# Patient Record
Sex: Male | Born: 1970 | Race: Black or African American | Hispanic: No | Marital: Single | Smoking: Never smoker
Health system: Southern US, Community
[De-identification: ages and names within clinical notes are randomized; demographics above are authoritative.]

## PROBLEM LIST (undated history)

## (undated) DIAGNOSIS — I10 Essential (primary) hypertension: Secondary | ICD-10-CM

## (undated) DIAGNOSIS — Z21 Asymptomatic human immunodeficiency virus [HIV] infection status: Secondary | ICD-10-CM

## (undated) DIAGNOSIS — I639 Cerebral infarction, unspecified: Secondary | ICD-10-CM

## (undated) DIAGNOSIS — R569 Unspecified convulsions: Secondary | ICD-10-CM

## (undated) DIAGNOSIS — B20 Human immunodeficiency virus [HIV] disease: Secondary | ICD-10-CM

## (undated) HISTORY — PX: COSMETIC SURGERY: SHX468

## (undated) HISTORY — PX: BRAIN SURGERY: SHX531

---

## 2004-04-10 ENCOUNTER — Emergency Department (HOSPITAL_COMMUNITY): Admission: EM | Admit: 2004-04-10 | Discharge: 2004-04-10 | Payer: Self-pay | Admitting: Emergency Medicine

## 2005-03-02 ENCOUNTER — Emergency Department (HOSPITAL_COMMUNITY): Admission: EM | Admit: 2005-03-02 | Discharge: 2005-03-02 | Payer: Self-pay | Admitting: Emergency Medicine

## 2005-03-11 ENCOUNTER — Emergency Department: Payer: Self-pay | Admitting: Emergency Medicine

## 2005-04-09 ENCOUNTER — Emergency Department: Payer: Self-pay | Admitting: Emergency Medicine

## 2005-07-02 ENCOUNTER — Emergency Department: Payer: Self-pay | Admitting: Internal Medicine

## 2005-08-17 ENCOUNTER — Emergency Department: Payer: Self-pay | Admitting: Emergency Medicine

## 2005-10-14 ENCOUNTER — Emergency Department: Payer: Self-pay | Admitting: General Practice

## 2005-12-24 ENCOUNTER — Emergency Department: Payer: Self-pay | Admitting: Emergency Medicine

## 2006-06-06 ENCOUNTER — Emergency Department: Payer: Self-pay | Admitting: Emergency Medicine

## 2006-06-07 ENCOUNTER — Emergency Department: Payer: Self-pay | Admitting: Emergency Medicine

## 2006-06-10 ENCOUNTER — Emergency Department: Payer: Self-pay | Admitting: Emergency Medicine

## 2007-07-30 ENCOUNTER — Emergency Department: Payer: Self-pay | Admitting: Internal Medicine

## 2008-01-10 ENCOUNTER — Emergency Department: Payer: Self-pay | Admitting: Emergency Medicine

## 2008-08-14 ENCOUNTER — Emergency Department: Payer: Self-pay | Admitting: Emergency Medicine

## 2009-01-11 ENCOUNTER — Emergency Department: Payer: Self-pay | Admitting: Emergency Medicine

## 2009-02-03 ENCOUNTER — Emergency Department: Payer: Self-pay | Admitting: Emergency Medicine

## 2009-06-28 ENCOUNTER — Emergency Department: Payer: Self-pay | Admitting: Emergency Medicine

## 2009-09-10 ENCOUNTER — Emergency Department: Payer: Self-pay | Admitting: Emergency Medicine

## 2010-07-19 ENCOUNTER — Emergency Department: Payer: Self-pay | Admitting: Unknown Physician Specialty

## 2010-09-24 ENCOUNTER — Emergency Department: Payer: Self-pay | Admitting: Internal Medicine

## 2011-05-14 ENCOUNTER — Emergency Department: Payer: Self-pay | Admitting: Emergency Medicine

## 2011-07-06 ENCOUNTER — Emergency Department: Payer: Self-pay | Admitting: Emergency Medicine

## 2011-09-10 ENCOUNTER — Emergency Department: Payer: Self-pay | Admitting: Emergency Medicine

## 2011-10-13 ENCOUNTER — Emergency Department: Payer: Self-pay | Admitting: Emergency Medicine

## 2011-11-19 ENCOUNTER — Emergency Department: Payer: Self-pay | Admitting: Emergency Medicine

## 2011-11-19 LAB — CBC
HCT: 42.7 % (ref 40.0–52.0)
HGB: 13.7 g/dL (ref 13.0–18.0)
MCH: 28.1 pg (ref 26.0–34.0)
MCHC: 32.1 g/dL (ref 32.0–36.0)
MCV: 88 fL (ref 80–100)
Platelet: 208 10*3/uL (ref 150–440)
RBC: 4.87 10*6/uL (ref 4.40–5.90)
WBC: 8.6 10*3/uL (ref 3.8–10.6)

## 2011-11-19 LAB — COMPREHENSIVE METABOLIC PANEL
Alkaline Phosphatase: 78 U/L (ref 50–136)
Anion Gap: 11 (ref 7–16)
Bilirubin,Total: 0.2 mg/dL (ref 0.2–1.0)
Calcium, Total: 8.5 mg/dL (ref 8.5–10.1)
Chloride: 106 mmol/L (ref 98–107)
SGPT (ALT): 17 U/L
Sodium: 140 mmol/L (ref 136–145)
Total Protein: 7.7 g/dL (ref 6.4–8.2)

## 2011-11-19 LAB — URINALYSIS, COMPLETE
Nitrite: NEGATIVE
Ph: 5 (ref 4.5–8.0)
WBC UR: 2 /HPF (ref 0–5)

## 2011-12-30 ENCOUNTER — Emergency Department: Payer: Self-pay | Admitting: Emergency Medicine

## 2012-04-19 ENCOUNTER — Emergency Department: Payer: Self-pay | Admitting: Emergency Medicine

## 2012-05-28 ENCOUNTER — Emergency Department: Payer: Self-pay | Admitting: *Deleted

## 2012-09-22 ENCOUNTER — Emergency Department: Payer: Self-pay | Admitting: Emergency Medicine

## 2012-11-08 ENCOUNTER — Emergency Department: Payer: Self-pay | Admitting: Emergency Medicine

## 2013-03-12 ENCOUNTER — Emergency Department: Payer: Self-pay | Admitting: Emergency Medicine

## 2013-03-21 ENCOUNTER — Emergency Department (HOSPITAL_COMMUNITY): Payer: Medicaid Other

## 2013-03-21 ENCOUNTER — Emergency Department (HOSPITAL_COMMUNITY)
Admission: EM | Admit: 2013-03-21 | Discharge: 2013-03-21 | Disposition: A | Discharge status: Home / Self Care | Payer: Medicaid Other | Attending: Emergency Medicine | Admitting: Emergency Medicine

## 2013-03-21 DIAGNOSIS — Y9389 Activity, other specified: Secondary | ICD-10-CM | POA: Insufficient documentation

## 2013-03-21 DIAGNOSIS — S4980XA Other specified injuries of shoulder and upper arm, unspecified arm, initial encounter: Secondary | ICD-10-CM | POA: Insufficient documentation

## 2013-03-21 DIAGNOSIS — S46909A Unspecified injury of unspecified muscle, fascia and tendon at shoulder and upper arm level, unspecified arm, initial encounter: Secondary | ICD-10-CM | POA: Insufficient documentation

## 2013-03-21 DIAGNOSIS — S298XXA Other specified injuries of thorax, initial encounter: Secondary | ICD-10-CM | POA: Insufficient documentation

## 2013-03-21 DIAGNOSIS — M79602 Pain in left arm: Secondary | ICD-10-CM

## 2013-03-21 DIAGNOSIS — Y92410 Unspecified street and highway as the place of occurrence of the external cause: Secondary | ICD-10-CM | POA: Insufficient documentation

## 2013-03-21 DIAGNOSIS — T07XXXA Unspecified multiple injuries, initial encounter: Secondary | ICD-10-CM

## 2013-03-21 LAB — CBC WITH DIFFERENTIAL/PLATELET
Basophils Absolute: 0 10*3/uL (ref 0.0–0.1)
Basophils Relative: 1 % (ref 0–1)
Eosinophils Absolute: 0.2 10*3/uL (ref 0.0–0.7)
Eosinophils Relative: 3 % (ref 0–5)
HCT: 38.9 % — ABNORMAL LOW (ref 39.0–52.0)
Lymphocytes Relative: 38 % (ref 12–46)
Lymphs Abs: 2.1 10*3/uL (ref 0.7–4.0)
MCHC: 34.7 g/dL (ref 30.0–36.0)
MCV: 83.5 fL (ref 78.0–100.0)
Monocytes Absolute: 0.7 10*3/uL (ref 0.1–1.0)
Monocytes Relative: 13 % — ABNORMAL HIGH (ref 3–12)
Neutro Abs: 2.4 10*3/uL (ref 1.7–7.7)
RBC: 4.66 MIL/uL (ref 4.22–5.81)
RDW: 12.6 % (ref 11.5–15.5)
WBC: 5.4 10*3/uL (ref 4.0–10.5)

## 2013-03-21 LAB — BASIC METABOLIC PANEL
Calcium: 9.4 mg/dL (ref 8.4–10.5)
Creatinine, Ser: 0.98 mg/dL (ref 0.50–1.35)
GFR calc non Af Amer: >90 mL/min (ref >90)
Sodium: 140 mEq/L (ref 135–145)

## 2013-03-21 MED ORDER — MORPHINE SULFATE 4 MG/ML IJ SOLN
4.0000 mg | Freq: Once | INTRAMUSCULAR | Status: AC
  Administered 2013-03-21: 4 mg via INTRAVENOUS
  Filled 2013-03-21: qty 1

## 2013-03-21 MED ORDER — IBUPROFEN 800 MG PO TABS: 800.0000 mg | ORAL_TABLET | Freq: Three times a day (TID) | ORAL | Status: DC

## 2013-03-21 MED ORDER — OXYCODONE-ACETAMINOPHEN 5-325 MG PO TABS: 1.0000 | ORAL_TABLET | ORAL | Status: DC | PRN

## 2013-03-21 MED ORDER — IOHEXOL 300 MG/ML  SOLN
100.0000 mL | Freq: Once | INTRAMUSCULAR | Status: AC | PRN
  Administered 2013-03-21: 100 mL via INTRAVENOUS
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
## 2013-04-11 ENCOUNTER — Emergency Department: Payer: Self-pay | Admitting: Emergency Medicine

## 2013-04-11 LAB — ETHANOL: Ethanol %: <0.003 % (ref 0.000–0.080)

## 2013-04-11 LAB — CBC WITH DIFFERENTIAL/PLATELET
Basophil %: 0.9 %
Eosinophil %: 0.3 %
HCT: 40 % (ref 40.0–52.0)
HGB: 13.3 g/dL (ref 13.0–18.0)
Lymphocyte #: 1.3 10*3/uL (ref 1.0–3.6)
Lymphocyte %: 16 %
MCH: 28.4 pg (ref 26.0–34.0)
MCHC: 33.2 g/dL (ref 32.0–36.0)
Monocyte #: 1 x10 3/mm (ref 0.2–1.0)
Monocyte %: 12.7 %
Neutrophil %: 70.1 %
RDW: 13.4 % (ref 11.5–14.5)
WBC: 8.2 10*3/uL (ref 3.8–10.6)

## 2013-04-11 LAB — COMPREHENSIVE METABOLIC PANEL
Albumin: 3.5 g/dL (ref 3.4–5.0)
Alkaline Phosphatase: 111 U/L (ref 50–136)
Anion Gap: 6 — ABNORMAL LOW (ref 7–16)
BUN: 12 mg/dL (ref 7–18)
Bilirubin,Total: 0.3 mg/dL (ref 0.2–1.0)
Calcium, Total: 8.6 mg/dL (ref 8.5–10.1)
Co2: 23 mmol/L (ref 21–32)
EGFR (African American): >60
EGFR (Non-African Amer.): >60
Osmolality: 271 (ref 275–301)
SGOT(AST): 17 U/L (ref 15–37)
SGPT (ALT): 14 U/L (ref 12–78)
Sodium: 136 mmol/L (ref 136–145)
Total Protein: 7.4 g/dL (ref 6.4–8.2)

## 2013-04-11 LAB — URINALYSIS, COMPLETE
Bilirubin,UR: NEGATIVE
Ketone: NEGATIVE
Specific Gravity: 1.008 (ref 1.003–1.030)
Squamous Epithelial: 1
WBC UR: 27 /HPF (ref 0–5)

## 2013-04-16 LAB — CULTURE, BLOOD (SINGLE)

## 2013-05-16 ENCOUNTER — Emergency Department: Payer: Self-pay | Admitting: Emergency Medicine

## 2013-08-12 ENCOUNTER — Emergency Department: Payer: Self-pay | Admitting: Emergency Medicine

## 2013-08-12 LAB — RAPID INFLUENZA A&B ANTIGENS (ARMC ONLY)

## 2013-09-24 ENCOUNTER — Emergency Department: Payer: Self-pay | Admitting: Emergency Medicine

## 2013-10-19 ENCOUNTER — Emergency Department: Payer: Self-pay | Admitting: Emergency Medicine

## 2013-11-19 ENCOUNTER — Emergency Department: Payer: Self-pay | Admitting: Emergency Medicine

## 2013-11-19 LAB — COMPREHENSIVE METABOLIC PANEL
Albumin: 3.4 g/dL (ref 3.4–5.0)
Alkaline Phosphatase: 83 U/L
Anion Gap: 4 — ABNORMAL LOW (ref 7–16)
BUN: 14 mg/dL (ref 7–18)
Bilirubin,Total: 0.1 mg/dL — ABNORMAL LOW (ref 0.2–1.0)
CALCIUM: 8.1 mg/dL — AB (ref 8.5–10.1)
CO2: 25 mmol/L (ref 21–32)
CREATININE: 1 mg/dL (ref 0.60–1.30)
Chloride: 107 mmol/L (ref 98–107)
EGFR (African American): >60
EGFR (Non-African Amer.): >60
GLUCOSE: 77 mg/dL (ref 65–99)
OSMOLALITY: 271 (ref 275–301)
POTASSIUM: 3.9 mmol/L (ref 3.5–5.1)
SGOT(AST): 24 U/L (ref 15–37)
SGPT (ALT): 16 U/L (ref 12–78)
Sodium: 136 mmol/L (ref 136–145)
TOTAL PROTEIN: 7.2 g/dL (ref 6.4–8.2)

## 2013-11-19 LAB — URINALYSIS, COMPLETE
Bilirubin,UR: NEGATIVE
GLUCOSE, UR: NEGATIVE mg/dL (ref 0–75)
Ketone: NEGATIVE
NITRITE: NEGATIVE
PH: 6 (ref 4.5–8.0)
Protein: 30
SPECIFIC GRAVITY: 1.001 (ref 1.003–1.030)
Squamous Epithelial: <1

## 2013-11-19 LAB — ETHANOL

## 2013-11-19 LAB — CBC
HCT: 40 % (ref 40.0–52.0)
HGB: 13 g/dL (ref 13.0–18.0)
MCH: 28.9 pg (ref 26.0–34.0)
MCHC: 32.4 g/dL (ref 32.0–36.0)
MCV: 89 fL (ref 80–100)
Platelet: 228 10*3/uL (ref 150–440)
RBC: 4.49 10*6/uL (ref 4.40–5.90)
RDW: 15.1 % — AB (ref 11.5–14.5)
WBC: 7 10*3/uL (ref 3.8–10.6)

## 2014-01-03 ENCOUNTER — Ambulatory Visit: Payer: Self-pay | Admitting: Unknown Physician Specialty

## 2014-01-07 ENCOUNTER — Ambulatory Visit: Payer: Self-pay | Admitting: Unknown Physician Specialty

## 2014-06-11 ENCOUNTER — Emergency Department: Payer: Self-pay | Admitting: Emergency Medicine

## 2014-07-17 ENCOUNTER — Emergency Department: Payer: Self-pay | Admitting: Emergency Medicine

## 2014-07-17 LAB — URINALYSIS, COMPLETE
Bilirubin,UR: NEGATIVE
GLUCOSE, UR: NEGATIVE mg/dL (ref 0–75)
Ketone: NEGATIVE
NITRITE: NEGATIVE
PH: 6 (ref 4.5–8.0)
Protein: 100
RBC,UR: 23841 /HPF (ref 0–5)
SQUAMOUS EPITHELIAL: NONE SEEN
Specific Gravity: 1.021 (ref 1.003–1.030)
WBC UR: 1441 /HPF (ref 0–5)

## 2014-07-29 ENCOUNTER — Emergency Department: Payer: Self-pay | Admitting: Emergency Medicine

## 2014-07-29 LAB — COMPREHENSIVE METABOLIC PANEL
ALBUMIN: 3.6 g/dL (ref 3.4–5.0)
ALT: 26 U/L
Alkaline Phosphatase: 77 U/L
Anion Gap: 8 (ref 7–16)
BUN: 16 mg/dL (ref 7–18)
Bilirubin,Total: 0.5 mg/dL (ref 0.2–1.0)
Calcium, Total: 8.5 mg/dL (ref 8.5–10.1)
Chloride: 107 mmol/L (ref 98–107)
Co2: 23 mmol/L (ref 21–32)
Creatinine: 1.08 mg/dL (ref 0.60–1.30)
EGFR (African American): >60
Glucose: 79 mg/dL (ref 65–99)
OSMOLALITY: 276 (ref 275–301)
POTASSIUM: 4 mmol/L (ref 3.5–5.1)
SGOT(AST): 30 U/L (ref 15–37)
Sodium: 138 mmol/L (ref 136–145)
Total Protein: 7.7 g/dL (ref 6.4–8.2)

## 2014-07-29 LAB — URINALYSIS, COMPLETE
Bilirubin,UR: NEGATIVE
GLUCOSE, UR: NEGATIVE mg/dL (ref 0–75)
KETONE: NEGATIVE
Nitrite: NEGATIVE
PH: 6 (ref 4.5–8.0)
Protein: 100
Specific Gravity: 1.024 (ref 1.003–1.030)
Squamous Epithelial: 11

## 2014-07-29 LAB — CBC
HCT: 43.2 % (ref 40.0–52.0)
HGB: 14.1 g/dL (ref 13.0–18.0)
MCH: 29.5 pg (ref 26.0–34.0)
MCHC: 32.5 g/dL (ref 32.0–36.0)
MCV: 91 fL (ref 80–100)
Platelet: 256 10*3/uL (ref 150–440)
RBC: 4.76 10*6/uL (ref 4.40–5.90)
RDW: 13 % (ref 11.5–14.5)
WBC: 6.7 10*3/uL (ref 3.8–10.6)

## 2014-07-31 LAB — URINE CULTURE

## 2014-08-04 ENCOUNTER — Emergency Department: Payer: Self-pay | Admitting: Student

## 2014-08-25 ENCOUNTER — Emergency Department: Payer: Self-pay | Admitting: Student

## 2014-08-28 ENCOUNTER — Emergency Department: Payer: Self-pay | Admitting: Emergency Medicine

## 2015-03-08 ENCOUNTER — Encounter: Payer: Self-pay | Admitting: Urgent Care

## 2015-03-08 DIAGNOSIS — Z791 Long term (current) use of non-steroidal anti-inflammatories (NSAID): Secondary | ICD-10-CM | POA: Insufficient documentation

## 2015-03-08 DIAGNOSIS — Z7982 Long term (current) use of aspirin: Secondary | ICD-10-CM | POA: Diagnosis not present

## 2015-03-08 DIAGNOSIS — Z720 Tobacco use: Secondary | ICD-10-CM | POA: Diagnosis not present

## 2015-03-08 DIAGNOSIS — Z880 Allergy status to penicillin: Secondary | ICD-10-CM | POA: Diagnosis not present

## 2015-03-08 DIAGNOSIS — Z79810 Long term (current) use of selective estrogen receptor modulators (SERMs): Secondary | ICD-10-CM | POA: Diagnosis not present

## 2015-03-08 DIAGNOSIS — H109 Unspecified conjunctivitis: Secondary | ICD-10-CM | POA: Insufficient documentation

## 2015-03-08 DIAGNOSIS — Z79899 Other long term (current) drug therapy: Secondary | ICD-10-CM | POA: Diagnosis not present

## 2015-03-08 DIAGNOSIS — H578 Other specified disorders of eye and adnexa: Secondary | ICD-10-CM | POA: Diagnosis present

## 2015-03-08 NOTE — ED Notes (Deleted)
Patient presents with c/o RIGHT ear pain x 1 week. (+) bloody drainage noted yesterday. Denies fever.

## 2015-03-09 ENCOUNTER — Emergency Department
Admission: EM | Admit: 2015-03-09 | Discharge: 2015-03-09 | Disposition: A | Discharge status: Home / Self Care | Payer: Medicaid Other | Attending: Emergency Medicine | Admitting: Emergency Medicine

## 2015-03-09 ENCOUNTER — Encounter: Payer: Self-pay | Admitting: Urgent Care

## 2015-03-09 DIAGNOSIS — H109 Unspecified conjunctivitis: Secondary | ICD-10-CM

## 2015-03-09 MED ORDER — FLUORESCEIN SODIUM 1 MG OP STRP
ORAL_STRIP | OPHTHALMIC | Status: AC
Start: 2015-03-09 — End: 2015-03-09
  Administered 2015-03-09: 02:00:00
  Filled 2015-03-09: qty 1

## 2015-03-09 MED ORDER — ERYTHROMYCIN 5 MG/GM OP OINT
TOPICAL_OINTMENT | Freq: Once | OPHTHALMIC | Status: AC
  Administered 2015-03-09: 1 via OPHTHALMIC
  Filled 2015-03-09: qty 1

## 2015-03-09 MED ORDER — ERYTHROMYCIN 5 MG/GM OP OINT
TOPICAL_OINTMENT | Freq: Three times a day (TID) | OPHTHALMIC | Status: AC
Start: 2015-03-09 — End: 2015-03-19

## 2015-03-09 MED ORDER — TETRACAINE HCL 0.5 % OP SOLN
1.0000 [drp] | Freq: Once | OPHTHALMIC | Status: AC
  Administered 2015-03-09: 1 [drp] via OPHTHALMIC

## 2015-03-09 NOTE — ED Provider Notes (Addendum)
[facility] Emergency Department Provider Note  Time seen: 1:25 AM  I have reviewed the triage vital signs and the nursing notes.   HISTORY  Chief Complaint Eye Drainage    HPI Jonathan Deleon is a 44 y.o. male with 2-3 days of left eye redness and drainage. Patient states discomfort in the left eye, along with redness, and a yellowish discharge from the eye. States his symptoms have been ongoing for 2-3 days. She feels like there might be something in the eye. Denies any fever, nausea, vomiting. Denies any blurred vision or visual changes.     History reviewed. No pertinent past medical history.  There are no active problems to display for this patient.   History reviewed. No pertinent past surgical history.  Current Outpatient Rx  Name  Route  Sig  Dispense  Refill  . aspirin 325 MG tablet   Oral   Take 325 mg by mouth daily.         Marland Kitchen BIOTIN PO   Oral   Take 1 tablet by mouth daily.         Marland Kitchen efavirenz-emtricitabine-tenofovir (ATRIPLA) 600-200-300 MG per tablet   Oral   Take 1 tablet by mouth at bedtime.         Marland Kitchen estradiol (ESTRACE) 1 MG tablet   Oral   Take 4 mg by mouth daily.         Marland Kitchen ibuprofen (ADVIL,MOTRIN) 800 MG tablet   Oral   Take 1 tablet (800 mg total) by mouth 3 (three) times daily.   21 tablet   0   . medroxyPROGESTERone (PROVERA) 2.5 MG tablet   Oral   Take 2.5 mg by mouth daily.         Marland Kitchen oxyCODONE-acetaminophen (PERCOCET/ROXICET) 5-325 MG per tablet   Oral   Take 1 tablet by mouth every 4 (four) hours as needed for pain.   20 tablet   0     Allergies Penicillins  No family history on file.  Social History History  Substance Use Topics  . Smoking status: Current Every Day Smoker  . Smokeless tobacco: Not on file  . Alcohol Use: Yes    Review of Systems Constitutional: Negative for fever. Eyes: Positive for discharge, and redness. Cardiovascular: Negative for chest pain. Respiratory:  Negative for shortness of breath. Gastrointestinal: Negative for abdominal pain 10-point ROS otherwise negative.  ____________________________________________   PHYSICAL EXAM:  VITAL SIGNS: ED Triage Vitals  Enc Vitals Group     BP 03/09/15 0011 128/83 mmHg     Pulse Rate 03/09/15 0011 89     Resp 03/09/15 0011 18     Temp 03/09/15 0011 98.3 F (36.8 C)     Temp Source 03/09/15 0011 Oral     SpO2 03/09/15 0011 98 %     Weight 03/09/15 0011 160 lb (72.576 kg)     Height 03/09/15 0011 5\' 9"  (1.753 m)     Head Cir --      Peak Flow --      Pain Score 03/08/15 2347 7     Pain Loc --      Pain Edu? --      Excl. in GC? --     Constitutional: Alert and oriented. Well appearing and in no distress. Eyes: Left eye has conjunctival injection, mild clear/white discharge from the eye currently. 2 mm PERRL ENT   Head: Normocephalic and atraumatic. Cardiovascular: Normal rate, regular rhythm. No murmur Respiratory:  Normal respiratory effort without tachypnea nor retractions. Breath sounds are clear and equal bilaterally. No wheezes/rales/rhonchi. Gastrointestinal: Soft and nontender. No distention.   Musculoskeletal: Nontender with normal range of motion in all extremities.  Neurologic:  Normal speech and language. No gross focal neurologic deficits  Skin:  Skin is warm, dry and intact.  Psychiatric: Mood and affect are normal. Speech and behavior are normal.   ____________________________________________  INITIAL IMPRESSION / ASSESSMENT AND PLAN / ED COURSE  Pertinent labs & imaging results that were available during my care of the patient were reviewed by me and considered in my medical decision making (see chart for details).  Patient with exam consistent with a left conjunctivitis. Patient does not wear contacts. We will place the patient on erythromycin ointment, with primary care follow-up. Patient agreeable.  Patient eye examined with tetracaine, floor seen, no abrasion  seen. Eyelids everted, no foreign body identified.  ____________________________________________   FINAL CLINICAL IMPRESSION(S) / ED DIAGNOSES  Left conjunctivitis   Minna Antis, MD 03/09/15 0127  Minna Antis, MD 03/09/15 [phone]

## 2015-03-09 NOTE — ED Notes (Signed)
Patient presents with c/o drainage from LEFT eye x 2-3 days. Drainage is yellow. (+) scleral erythema noted.

## 2015-03-09 NOTE — Discharge Instructions (Signed)

## 2015-04-03 ENCOUNTER — Other Ambulatory Visit: Payer: Self-pay

## 2015-04-03 DIAGNOSIS — Z7982 Long term (current) use of aspirin: Secondary | ICD-10-CM | POA: Diagnosis not present

## 2015-04-03 DIAGNOSIS — Y9389 Activity, other specified: Secondary | ICD-10-CM | POA: Diagnosis not present

## 2015-04-03 DIAGNOSIS — Y998 Other external cause status: Secondary | ICD-10-CM | POA: Diagnosis not present

## 2015-04-03 DIAGNOSIS — S161XXA Strain of muscle, fascia and tendon at neck level, initial encounter: Secondary | ICD-10-CM | POA: Insufficient documentation

## 2015-04-03 DIAGNOSIS — Z79899 Other long term (current) drug therapy: Secondary | ICD-10-CM | POA: Diagnosis not present

## 2015-04-03 DIAGNOSIS — S20219A Contusion of unspecified front wall of thorax, initial encounter: Secondary | ICD-10-CM | POA: Insufficient documentation

## 2015-04-03 DIAGNOSIS — Y92410 Unspecified street and highway as the place of occurrence of the external cause: Secondary | ICD-10-CM | POA: Diagnosis not present

## 2015-04-03 DIAGNOSIS — Z720 Tobacco use: Secondary | ICD-10-CM | POA: Diagnosis not present

## 2015-04-03 DIAGNOSIS — R0981 Nasal congestion: Secondary | ICD-10-CM | POA: Diagnosis not present

## 2015-04-03 DIAGNOSIS — Z880 Allergy status to penicillin: Secondary | ICD-10-CM | POA: Insufficient documentation

## 2015-04-03 DIAGNOSIS — S299XXA Unspecified injury of thorax, initial encounter: Secondary | ICD-10-CM | POA: Diagnosis present

## 2015-04-03 NOTE — ED Notes (Signed)
Called from lobby for triage with no answer
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
## 2015-04-03 NOTE — ED Notes (Signed)
No answer when called from lobby for triage 

## 2015-04-04 ENCOUNTER — Emergency Department: Payer: Medicaid Other

## 2015-04-04 ENCOUNTER — Emergency Department
Admission: EM | Admit: 2015-04-04 | Discharge: 2015-04-04 | Disposition: A | Discharge status: Home / Self Care | Payer: Medicaid Other | Attending: Emergency Medicine | Admitting: Emergency Medicine

## 2015-04-04 DIAGNOSIS — S20219A Contusion of unspecified front wall of thorax, initial encounter: Secondary | ICD-10-CM

## 2015-04-04 DIAGNOSIS — S161XXA Strain of muscle, fascia and tendon at neck level, initial encounter: Secondary | ICD-10-CM

## 2015-04-04 DIAGNOSIS — R0981 Nasal congestion: Secondary | ICD-10-CM

## 2015-04-04 MED ORDER — CYCLOBENZAPRINE HCL 10 MG PO TABS
5.0000 mg | ORAL_TABLET | Freq: Once | ORAL | Status: AC
  Administered 2015-04-04: 5 mg via ORAL
  Filled 2015-04-04: qty 1

## 2015-04-04 MED ORDER — IBUPROFEN 600 MG PO TABS: 600.0000 mg | ORAL_TABLET | Freq: Three times a day (TID) | ORAL | Status: DC | PRN

## 2015-04-04 MED ORDER — HYDROCODONE-ACETAMINOPHEN 5-325 MG PO TABS: 1.0000 | ORAL_TABLET | Freq: Four times a day (QID) | ORAL | Status: DC | PRN

## 2015-04-04 MED ORDER — AZITHROMYCIN 250 MG PO TABS
500.0000 mg | ORAL_TABLET | Freq: Once | ORAL | Status: AC
  Administered 2015-04-04: 500 mg via ORAL
  Filled 2015-04-04: qty 2

## 2015-04-04 MED ORDER — CYCLOBENZAPRINE HCL 5 MG PO TABS: 5.0000 mg | ORAL_TABLET | Freq: Three times a day (TID) | ORAL | Status: DC | PRN

## 2015-04-04 MED ORDER — IBUPROFEN 600 MG PO TABS
600.0000 mg | ORAL_TABLET | Freq: Once | ORAL | Status: AC
  Administered 2015-04-04: 600 mg via ORAL
  Filled 2015-04-04: qty 1

## 2015-04-04 MED ORDER — AZITHROMYCIN 250 MG PO TABS: 250.0000 mg | ORAL_TABLET | Freq: Every day | ORAL | Status: DC

## 2015-04-04 MED ORDER — HYDROCODONE-ACETAMINOPHEN 5-325 MG PO TABS
1.0000 | ORAL_TABLET | Freq: Once | ORAL | Status: AC
  Administered 2015-04-04: 1 via ORAL
  Filled 2015-04-04: qty 1
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
## 2015-07-16 ENCOUNTER — Emergency Department (HOSPITAL_COMMUNITY): Payer: Medicaid Other

## 2015-07-16 ENCOUNTER — Emergency Department (HOSPITAL_COMMUNITY)
Admission: EM | Admit: 2015-07-16 | Discharge: 2015-07-16 | Disposition: A | Discharge status: Home / Self Care | Payer: Medicaid Other | Attending: Emergency Medicine | Admitting: Emergency Medicine

## 2015-07-16 ENCOUNTER — Encounter (HOSPITAL_COMMUNITY): Payer: Self-pay | Admitting: Oncology

## 2015-07-16 DIAGNOSIS — F10920 Alcohol use, unspecified with intoxication, uncomplicated: Secondary | ICD-10-CM

## 2015-07-16 DIAGNOSIS — F17200 Nicotine dependence, unspecified, uncomplicated: Secondary | ICD-10-CM | POA: Diagnosis not present

## 2015-07-16 DIAGNOSIS — S0081XA Abrasion of other part of head, initial encounter: Secondary | ICD-10-CM | POA: Diagnosis not present

## 2015-07-16 DIAGNOSIS — S80211A Abrasion, right knee, initial encounter: Secondary | ICD-10-CM | POA: Diagnosis not present

## 2015-07-16 DIAGNOSIS — S60512A Abrasion of left hand, initial encounter: Secondary | ICD-10-CM | POA: Insufficient documentation

## 2015-07-16 DIAGNOSIS — Z793 Long term (current) use of hormonal contraceptives: Secondary | ICD-10-CM | POA: Diagnosis not present

## 2015-07-16 DIAGNOSIS — Y9389 Activity, other specified: Secondary | ICD-10-CM | POA: Insufficient documentation

## 2015-07-16 DIAGNOSIS — S80212A Abrasion, left knee, initial encounter: Secondary | ICD-10-CM | POA: Insufficient documentation

## 2015-07-16 DIAGNOSIS — F10120 Alcohol abuse with intoxication, uncomplicated: Secondary | ICD-10-CM | POA: Insufficient documentation

## 2015-07-16 DIAGNOSIS — X58XXXA Exposure to other specified factors, initial encounter: Secondary | ICD-10-CM | POA: Insufficient documentation

## 2015-07-16 DIAGNOSIS — Z79899 Other long term (current) drug therapy: Secondary | ICD-10-CM | POA: Diagnosis not present

## 2015-07-16 DIAGNOSIS — F10129 Alcohol abuse with intoxication, unspecified: Secondary | ICD-10-CM | POA: Diagnosis present

## 2015-07-16 DIAGNOSIS — T07XXXA Unspecified multiple injuries, initial encounter: Secondary | ICD-10-CM

## 2015-07-16 DIAGNOSIS — Y9289 Other specified places as the place of occurrence of the external cause: Secondary | ICD-10-CM | POA: Diagnosis not present

## 2015-07-16 DIAGNOSIS — Y998 Other external cause status: Secondary | ICD-10-CM | POA: Insufficient documentation

## 2015-07-16 DIAGNOSIS — Z7982 Long term (current) use of aspirin: Secondary | ICD-10-CM | POA: Diagnosis not present

## 2015-07-16 DIAGNOSIS — Z880 Allergy status to penicillin: Secondary | ICD-10-CM | POA: Diagnosis not present

## 2015-07-16 LAB — CBC WITH DIFFERENTIAL/PLATELET
BASOS ABS: 0 10*3/uL (ref 0.0–0.1)
Basophils Relative: 0 %
EOS PCT: 2 %
Eosinophils Absolute: 0.1 10*3/uL (ref 0.0–0.7)
HCT: 41.1 % (ref 39.0–52.0)
HEMOGLOBIN: 13.8 g/dL (ref 13.0–17.0)
LYMPHS PCT: 45 %
Lymphs Abs: 2.4 10*3/uL (ref 0.7–4.0)
MCH: 29.4 pg (ref 26.0–34.0)
MCHC: 33.6 g/dL (ref 30.0–36.0)
MCV: 87.4 fL (ref 78.0–100.0)
Monocytes Absolute: 0.5 10*3/uL (ref 0.1–1.0)
Monocytes Relative: 9 %
NEUTROS PCT: 44 %
Neutro Abs: 2.3 10*3/uL (ref 1.7–7.7)
PLATELETS: 246 10*3/uL (ref 150–400)
RBC: 4.7 MIL/uL (ref 4.22–5.81)
RDW: 12.9 % (ref 11.5–15.5)
WBC: 5.2 10*3/uL (ref 4.0–10.5)

## 2015-07-16 LAB — COMPREHENSIVE METABOLIC PANEL
ALBUMIN: 4.1 g/dL (ref 3.5–5.0)
ALT: 24 U/L (ref 17–63)
AST: 29 U/L (ref 15–41)
Alkaline Phosphatase: 78 U/L (ref 38–126)
Anion gap: 7 (ref 5–15)
BUN: 14 mg/dL (ref 6–20)
CHLORIDE: 104 mmol/L (ref 101–111)
CO2: 23 mmol/L (ref 22–32)
CREATININE: 1.05 mg/dL (ref 0.61–1.24)
Calcium: 8.7 mg/dL — ABNORMAL LOW (ref 8.9–10.3)
GFR calc Af Amer: >60 mL/min (ref >60)
GFR calc non Af Amer: >60 mL/min (ref >60)
GLUCOSE: 110 mg/dL — AB (ref 65–99)
POTASSIUM: 3.3 mmol/L — AB (ref 3.5–5.1)
Sodium: 134 mmol/L — ABNORMAL LOW (ref 135–145)
Total Bilirubin: 0.5 mg/dL (ref 0.3–1.2)
Total Protein: 7.6 g/dL (ref 6.5–8.1)

## 2015-07-16 LAB — ETHANOL: ALCOHOL ETHYL (B): 162 mg/dL — AB (ref <5)

## 2015-07-16 MED ORDER — KETOROLAC TROMETHAMINE 60 MG/2ML IM SOLN
60.0000 mg | Freq: Once | INTRAMUSCULAR | Status: AC
  Administered 2015-07-16: 60 mg via INTRAMUSCULAR
  Filled 2015-07-16: qty 2

## 2015-07-16 MED ORDER — IBUPROFEN 600 MG PO TABS: 600.0000 mg | ORAL_TABLET | Freq: Three times a day (TID) | ORAL | Status: DC | PRN

## 2015-07-16 MED ORDER — ONDANSETRON HCL 4 MG/2ML IJ SOLN
4.0000 mg | Freq: Once | INTRAMUSCULAR | Status: AC
  Administered 2015-07-16: 4 mg via INTRAVENOUS
  Filled 2015-07-16: qty 2
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
## 2015-07-16 NOTE — ED Notes (Signed)
Pt is calm and cooperative at this time. Pt does have superficial abrasions to left hand and left face.
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
## 2015-07-16 NOTE — ED Notes (Signed)
Pt at nursing station on phone and sat down onto the floor unexpectedly and continued her telephone conversation.

## 2015-07-16 NOTE — Discharge Instructions (Signed)
Abrasion °An abrasion is a cut or scrape on the outer surface of your skin. An abrasion does not extend through all of the layers of your skin. It is important to care for your abrasion properly to prevent infection. °CAUSES °Most abrasions are caused by falling on or gliding across the ground or another surface. When your skin rubs on something, the outer and inner layer of skin rubs off.  °SYMPTOMS °A cut or scrape is the main symptom of this condition. The scrape may be bleeding, or it may appear red or pink. If there was an associated fall, there may be an underlying bruise. °DIAGNOSIS °An abrasion is diagnosed with a physical exam. °TREATMENT °Treatment for this condition depends on how large and deep the abrasion is. Usually, your abrasion will be cleaned with water and mild soap. This removes any dirt or debris that may be stuck. An antibiotic ointment may be applied to the abrasion to help prevent infection. A bandage (dressing) may be placed on the abrasion to keep it clean. °You may also need a tetanus shot. °HOME CARE INSTRUCTIONS °Medicines °· Take or apply medicines only as directed by your health care provider. °· If you were prescribed an antibiotic ointment, finish all of it even if you start to feel better. °Wound Care °· Clean the wound with mild soap and water 2-3 times per day or as directed by your health care provider. Pat your wound dry with a clean towel. Do not rub it. °· There are many different ways to close and cover a wound. Follow instructions from your health care provider about: °¨ Wound care. °¨ Dressing changes and removal. °· Check your wound every day for signs of infection. Watch for: °¨ Redness, swelling, or pain. °¨ Fluid, blood, or pus. °General Instructions °· Keep the dressing dry as directed by your health care provider. Do not take baths, swim, use a hot tub, or do anything that would put your wound underwater until your health care provider approves. °· If there is  swelling, raise (elevate) the injured area above the level of your heart while you are sitting or lying down. °· Keep all follow-up visits as directed by your health care provider. This is important. °SEEK MEDICAL CARE IF: °· You received a tetanus shot and you have swelling, severe pain, redness, or bleeding at the injection site. °· Your pain is not controlled with medicine. °· You have increased redness, swelling, or pain at the site of your wound. °SEEK IMMEDIATE MEDICAL CARE IF: °· You have a red streak going away from your wound. °· You have a fever. °· You have fluid, blood, or pus coming from your wound. °· You notice a bad smell coming from your wound or your dressing. °  °This information is not intended to replace advice given to you by your health care provider. Make sure you discuss any questions you have with your health care provider. °  °Document Released: 05/01/2005 Document Revised: 04/12/2015 Document Reviewed: 07/20/2014 °Elsevier Interactive Patient Education ©2016 Elsevier Inc. °Contusion °A contusion is a deep bruise. Contusions are the result of a blunt injury to tissues and muscle fibers under the skin. The injury causes bleeding under the skin. The skin overlying the contusion may turn blue, purple, or yellow. Minor injuries will give you a painless contusion, but more severe contusions may stay painful and swollen for a few weeks.  °CAUSES  °This condition is usually caused by a blow, trauma, or direct force to   an area of the body. °SYMPTOMS  °Symptoms of this condition include: °· Swelling of the injured area. °· Pain and tenderness in the injured area. °· Discoloration. The area may have redness and then turn blue, purple, or yellow. °DIAGNOSIS  °This condition is diagnosed based on a physical exam and medical history. An X-ray, CT scan, or MRI may be needed to determine if there are any associated injuries, such as broken bones (fractures). °TREATMENT  °Specific treatment for this  condition depends on what area of the body was injured. In general, the best treatment for a contusion is resting, icing, applying pressure to (compression), and elevating the injured area. This is often called the RICE strategy. Over-the-counter anti-inflammatory medicines may also be recommended for pain control.  °HOME CARE INSTRUCTIONS  °· Rest the injured area. °· If directed, apply ice to the injured area: °¨ Put ice in a plastic bag. °¨ Place a towel between your skin and the bag. °¨ Leave the ice on for 20 minutes, 2-3 times per day. °· If directed, apply light compression to the injured area using an elastic bandage. Make sure the bandage is not wrapped too tightly. Remove and reapply the bandage as directed by your health care provider. °· If possible, raise (elevate) the injured area above the level of your heart while you are sitting or lying down. °· Take over-the-counter and prescription medicines only as told by your health care provider. °SEEK MEDICAL CARE IF: °· Your symptoms do not improve after several days of treatment. °· Your symptoms get worse. °· You have difficulty moving the injured area. °SEEK IMMEDIATE MEDICAL CARE IF:  °· You have severe pain. °· You have numbness in a hand or foot. °· Your hand or foot turns pale or cold. °  °This information is not intended to replace advice given to you by your health care provider. Make sure you discuss any questions you have with your health care provider. °  °Document Released: 05/01/2005 Document Revised: 04/12/2015 Document Reviewed: 12/07/2014 °Elsevier Interactive Patient Education ©2016 Elsevier Inc. ° °

## 2015-07-16 NOTE — ED Notes (Signed)
Pt is ambulating w/o difficulty w/ a steady gait.

## 2015-07-31 ENCOUNTER — Encounter: Payer: Self-pay | Admitting: Emergency Medicine

## 2015-07-31 ENCOUNTER — Emergency Department: Payer: Medicaid Other

## 2015-07-31 ENCOUNTER — Emergency Department
Admission: EM | Admit: 2015-07-31 | Discharge: 2015-07-31 | Disposition: A | Discharge status: Home / Self Care | Payer: Medicaid Other | Attending: Emergency Medicine | Admitting: Emergency Medicine

## 2015-07-31 DIAGNOSIS — Z87891 Personal history of nicotine dependence: Secondary | ICD-10-CM | POA: Diagnosis not present

## 2015-07-31 DIAGNOSIS — Z880 Allergy status to penicillin: Secondary | ICD-10-CM | POA: Diagnosis not present

## 2015-07-31 DIAGNOSIS — J069 Acute upper respiratory infection, unspecified: Secondary | ICD-10-CM | POA: Insufficient documentation

## 2015-07-31 DIAGNOSIS — Y9389 Activity, other specified: Secondary | ICD-10-CM | POA: Insufficient documentation

## 2015-07-31 DIAGNOSIS — Y998 Other external cause status: Secondary | ICD-10-CM | POA: Insufficient documentation

## 2015-07-31 DIAGNOSIS — Y9289 Other specified places as the place of occurrence of the external cause: Secondary | ICD-10-CM | POA: Insufficient documentation

## 2015-07-31 DIAGNOSIS — Z79899 Other long term (current) drug therapy: Secondary | ICD-10-CM | POA: Diagnosis not present

## 2015-07-31 DIAGNOSIS — R05 Cough: Secondary | ICD-10-CM | POA: Diagnosis present

## 2015-07-31 DIAGNOSIS — K625 Hemorrhage of anus and rectum: Secondary | ICD-10-CM | POA: Diagnosis present

## 2015-07-31 DIAGNOSIS — S299XXA Unspecified injury of thorax, initial encounter: Secondary | ICD-10-CM

## 2015-07-31 DIAGNOSIS — Z7982 Long term (current) use of aspirin: Secondary | ICD-10-CM | POA: Diagnosis not present

## 2015-07-31 LAB — CBC
HCT: 43.8 % (ref 40.0–52.0)
Hemoglobin: 14.2 g/dL (ref 13.0–18.0)
MCH: 28.3 pg (ref 26.0–34.0)
MCHC: 32.4 g/dL (ref 32.0–36.0)
MCV: 87.3 fL (ref 80.0–100.0)
PLATELETS: 226 10*3/uL (ref 150–440)
RBC: 5.01 MIL/uL (ref 4.40–5.90)
RDW: 13.7 % (ref 11.5–14.5)
WBC: 5.6 10*3/uL (ref 3.8–10.6)

## 2015-07-31 LAB — COMPREHENSIVE METABOLIC PANEL
ALT: 18 U/L (ref 17–63)
ANION GAP: 5 (ref 5–15)
AST: 20 U/L (ref 15–41)
Albumin: 3.9 g/dL (ref 3.5–5.0)
Alkaline Phosphatase: 68 U/L (ref 38–126)
BUN: 18 mg/dL (ref 6–20)
CHLORIDE: 107 mmol/L (ref 101–111)
CO2: 23 mmol/L (ref 22–32)
CREATININE: 1.01 mg/dL (ref 0.61–1.24)
Calcium: 8.6 mg/dL — ABNORMAL LOW (ref 8.9–10.3)
Glucose, Bld: 93 mg/dL (ref 65–99)
POTASSIUM: 4.3 mmol/L (ref 3.5–5.1)
Sodium: 135 mmol/L (ref 135–145)
Total Bilirubin: 0.4 mg/dL (ref 0.3–1.2)
Total Protein: 7.1 g/dL (ref 6.5–8.1)

## 2015-07-31 LAB — LIPASE, BLOOD: LIPASE: 20 U/L (ref 11–51)

## 2015-07-31 MED ORDER — IBUPROFEN 800 MG PO TABS: 800.0000 mg | ORAL_TABLET | Freq: Three times a day (TID) | ORAL | Status: DC | PRN

## 2015-07-31 MED ORDER — BENZONATATE 100 MG PO CAPS
200.0000 mg | ORAL_CAPSULE | Freq: Once | ORAL | Status: AC
  Administered 2015-07-31: 200 mg via ORAL

## 2015-07-31 MED ORDER — CYCLOBENZAPRINE HCL 10 MG PO TABS: 10.0000 mg | ORAL_TABLET | Freq: Three times a day (TID) | ORAL | Status: DC | PRN

## 2015-07-31 MED ORDER — AZITHROMYCIN 250 MG PO TABS: ORAL_TABLET | ORAL | Status: DC

## 2015-07-31 MED ORDER — BENZONATATE 100 MG PO CAPS: 100.0000 mg | ORAL_CAPSULE | Freq: Three times a day (TID) | ORAL | Status: DC | PRN

## 2015-07-31 MED ORDER — BENZONATATE 100 MG PO CAPS
ORAL_CAPSULE | ORAL | Status: AC
  Filled 2015-07-31: qty 2

## 2015-07-31 NOTE — ED Notes (Signed)
pt reports productive cough and sinus congestion, was seen here today for the same

## 2015-07-31 NOTE — ED Notes (Signed)
PT reports cough and was seen here earlier today.  Pt requesting an antibiodic  Pt states cough worse tonight.  Nonsmoker.

## 2015-07-31 NOTE — Discharge Instructions (Signed)
Upper Respiratory Infection, Adult Most upper respiratory infections (URIs) are caused by a virus. A URI affects the nose, throat, and upper air passages. The most common type of URI is often called "the common cold." HOME CARE   Take medicines only as told by your doctor.  Gargle warm saltwater or take cough drops to comfort your throat as told by your doctor.  Use a warm mist humidifier or inhale steam from a shower to increase air moisture. This may make it easier to breathe.  Drink enough fluid to keep your pee (urine) clear or pale yellow.  Eat soups and other clear broths.  Have a healthy diet.  Rest as needed.  Go back to work when your fever is gone or your doctor says it is okay.  You may need to stay home longer to avoid giving your URI to others.  You can also wear a face mask and wash your hands often to prevent spread of the virus.  Use your inhaler more if you have asthma.  Do not use any tobacco products, including cigarettes, chewing tobacco, or electronic cigarettes. If you need help quitting, ask your doctor. GET HELP IF:  You are getting worse, not better.  Your symptoms are not helped by medicine.  You have chills.  You are getting more short of breath.  You have brown or red mucus.  You have yellow or brown discharge from your nose.  You have pain in your face, especially when you bend forward.  You have a fever.  You have puffy (swollen) neck glands.  You have pain while swallowing.  You have white areas in the back of your throat. GET HELP RIGHT AWAY IF:   You have very bad or constant:  Headache.  Ear pain.  Pain in your forehead, behind your eyes, and over your cheekbones (sinus pain).  Chest pain.  You have long-lasting (chronic) lung disease and any of the following:  Wheezing.  Long-lasting cough.  Coughing up blood.  A change in your usual mucus.  You have a stiff neck.  You have changes in  your:  Vision.  Hearing.  Thinking.  Mood. MAKE SURE YOU:   Understand these instructions.  Will watch your condition.  Will get help right away if you are not doing well or get worse.   This information is not intended to replace advice given to you by your health care provider. Make sure you discuss any questions you have with your health care provider.   Document Released: 01/08/2008 Document Revised: 12/06/2014 Document Reviewed: 10/27/2013 Elsevier Interactive Patient Education 2016 [employer]   [facility] your doctor or [facility] if any continued problems. Begin taking Zithromax as directed and Tessalon Perles as needed for coughing. Continue fluids and Tylenol or Motrin as needed for fever or chest wall pain.
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
## 2015-07-31 NOTE — Discharge Instructions (Signed)
Blunt Chest Trauma Blunt chest trauma is an injury caused by a blow to the chest. These chest injuries can be very painful. Blunt chest trauma often results in bruised or broken (fractured) ribs. Most cases of bruised and fractured ribs from blunt chest traumas get better after 1 to 3 weeks of rest and pain medicine. Often, the soft tissue in the chest wall is also injured, causing pain and bruising. Internal organs, such as the heart and lungs, may also be injured. Blunt chest trauma can lead to serious medical problems. This injury requires immediate medical care. CAUSES   Motor vehicle collisions.  Falls.  Physical violence.  Sports injuries. SYMPTOMS   Chest pain. The pain may be worse when you move or breathe deeply.  Shortness of breath.  Lightheadedness.  Bruising.  Tenderness.  Swelling. DIAGNOSIS  Your caregiver will do a physical exam. X-rays may be taken to look for fractures. However, minor rib fractures may not show up on X-rays until a few days after the injury. If a more serious injury is suspected, further imaging tests may be done. This may include ultrasounds, computed tomography (CT) scans, or magnetic resonance imaging (MRI). TREATMENT  Treatment depends on the severity of your injury. Your caregiver may prescribe pain medicines and deep breathing exercises. HOME CARE INSTRUCTIONS  Limit your activities until you can move around without much pain.  Do not do any strenuous work until your injury is healed.  Put ice on the injured area.  Put ice in a plastic bag.  Place a towel between your skin and the bag.  Leave the ice on for 15-20 minutes, 03-04 times a day.  You may wear a rib belt as directed by your caregiver to reduce pain.  Practice deep breathing as directed by your caregiver to keep your lungs clear.  Only take over-the-counter or prescription medicines for pain, fever, or discomfort as directed by your caregiver. SEEK IMMEDIATE MEDICAL  CARE IF:   You have increasing pain or shortness of breath.  You cough up blood.  You have nausea, vomiting, or abdominal pain.  You have a fever.  You feel dizzy, weak, or you faint. MAKE SURE YOU:  Understand these instructions.  Will watch your condition.  Will get help right away if you are not doing well or get worse.   This information is not intended to replace advice given to you by your health care provider. Make sure you discuss any questions you have with your health care provider.   Document Released: 08/29/2004 Document Revised: 08/12/2014 Document Reviewed: 01/18/2015 Elsevier Interactive Patient Education 2016 [employer]  Upper Respiratory Infection, Adult Most upper respiratory infections (URIs) are a viral infection of the air passages leading to the lungs. A URI affects the nose, throat, and upper air passages. The most common type of URI is nasopharyngitis and is typically referred to as "the common cold." URIs run their course and usually go away on their own. Most of the time, a URI does not require medical attention, but sometimes a bacterial infection in the upper airways can follow a viral infection. This is called a secondary infection. Sinus and middle ear infections are common types of secondary upper respiratory infections. Bacterial pneumonia can also complicate a URI. A URI can worsen asthma and chronic obstructive pulmonary disease (COPD). Sometimes, these complications can require emergency medical care and may be life threatening.  CAUSES Almost all URIs are caused by viruses. A virus is a type of germ and  can spread from one person to another.  RISKS FACTORS You may be at risk for a URI if:   You smoke.   You have chronic heart or lung disease.  You have a weakened defense (immune) system.   You are very young or very old.   You have nasal allergies or asthma.  You work in crowded or poorly ventilated areas.  You work in health  care facilities or schools. SIGNS AND SYMPTOMS  Symptoms typically develop 2-3 days after you come in contact with a cold virus. Most viral URIs last 7-10 days. However, viral URIs from the influenza virus (flu virus) can last 14-18 days and are typically more severe. Symptoms may include:   Runny or stuffy (congested) nose.   Sneezing.   Cough.   Sore throat.   Headache.   Fatigue.   Fever.   Loss of appetite.   Pain in your forehead, behind your eyes, and over your cheekbones (sinus pain).  Muscle aches.  DIAGNOSIS  Your health care provider may diagnose a URI by:  Physical exam.  Tests to check that your symptoms are not due to another condition such as:  Strep throat.  Sinusitis.  Pneumonia.  Asthma. TREATMENT  A URI goes away on its own with time. It cannot be cured with medicines, but medicines may be prescribed or recommended to relieve symptoms. Medicines may help:  Reduce your fever.  Reduce your cough.  Relieve nasal congestion. HOME CARE INSTRUCTIONS   Take medicines only as directed by your health care provider.   Gargle warm saltwater or take cough drops to comfort your throat as directed by your health care provider.  Use a warm mist humidifier or inhale steam from a shower to increase air moisture. This may make it easier to breathe.  Drink enough fluid to keep your urine clear or pale yellow.   Eat soups and other clear broths and maintain good nutrition.   Rest as needed.   Return to work when your temperature has returned to normal or as your health care provider advises. You may need to stay home longer to avoid infecting others. You can also use a face mask and careful hand washing to prevent spread of the virus.  Increase the usage of your inhaler if you have asthma.   Do not use any tobacco products, including cigarettes, chewing tobacco, or electronic cigarettes. If you need help quitting, ask your health care  provider. PREVENTION  The best way to protect yourself from getting a cold is to practice good hygiene.   Avoid oral or hand contact with people with cold symptoms.   Wash your hands often if contact occurs.  There is no clear evidence that vitamin C, vitamin E, echinacea, or exercise reduces the chance of developing a cold. However, it is always recommended to get plenty of rest, exercise, and practice good nutrition.  SEEK MEDICAL CARE IF:   You are getting worse rather than better.   Your symptoms are not controlled by medicine.   You have chills.  You have worsening shortness of breath.  You have brown or red mucus.  You have yellow or brown nasal discharge.  You have pain in your face, especially when you bend forward.  You have a fever.  You have swollen neck glands.  You have pain while swallowing.  You have white areas in the back of your throat. SEEK IMMEDIATE MEDICAL CARE IF:   You have severe or persistent:  Headache.  Ear pain.  Sinus pain.  Chest pain.  You have chronic lung disease and any of the following:  Wheezing.  Prolonged cough.  Coughing up blood.  A change in your usual mucus.  You have a stiff neck.  You have changes in your:  Vision.  Hearing.  Thinking.  Mood. MAKE SURE YOU:   Understand these instructions.  Will watch your condition.  Will get help right away if you are not doing well or get worse.   This information is not intended to replace advice given to you by your health care provider. Make sure you discuss any questions you have with your health care provider.   Document Released: 01/15/2001 Document Revised: 12/06/2014 Document Reviewed: 10/27/2013 Elsevier Interactive Patient Education [employer]

## 2015-07-31 NOTE — ED Notes (Signed)
Rectal bleeding since 07/15/15, bruising to left rib and pain, possible pink eye, sinus pressure and pain.

## 2015-07-31 NOTE — ED Provider Notes (Signed)
[facility] Emergency Department Provider Note  ____________________________________________  Time seen: Approximately 10:34 PM  I have reviewed the triage vital signs and the nursing notes.   HISTORY  Chief Complaint Cough  HPI El Diego A Ryant is a 44 y.o. male patient's is in the emergency room tonight with complaint of productive cough and sinus congestion. Patient states that he was here earlier this morning and had lab work done and a chest x-ray. Chest x-ray did not show any pneumonia or bronchitis. He did not receive any medication is been taking over-the-counter Sudafed as needed for congestion. As the night has gone on the cough has gotten worse and there is increased chest wall pain. There is no reported shortness of breath and productive cough seems to be worse after taking some Robitussin tonight. Patient is a nonsmoker. Currently he complains of cough causing both his "breast to want to follow off" and rates it as a 10 out of 10.   History reviewed. No pertinent past medical history.  There are no active problems to display for this patient.   Past Surgical History  Procedure Laterality Date  . Brain surgery      Current Outpatient Rx  Name  Route  Sig  Dispense  Refill  . aspirin 325 MG tablet   Oral   Take 325 mg by mouth daily.         Marland Kitchen azithromycin (ZITHROMAX Z-PAK) 250 MG tablet      Take 2 tablets (500 mg) on  Day 1,  followed by 1 tablet (250 mg) once daily on Days 2 through 5.   6 each   0   . benzonatate (TESSALON PERLES) 100 MG capsule   Oral   Take 1 capsule (100 mg total) by mouth 3 (three) times daily as needed for cough.   30 capsule   0   . BIOTIN PO   Oral   Take 2 tablets by mouth daily.          . cetirizine (ZYRTEC) 5 MG tablet   Oral   Take 10 mg by mouth daily.         . cyclobenzaprine (FLEXERIL) 10 MG tablet   Oral   Take 1 tablet (10 mg total) by mouth every 8 (eight) hours as needed for  muscle spasms.   30 tablet   1   . efavirenz-emtricitabine-tenofovir (ATRIPLA) 600-200-300 MG per tablet   Oral   Take 1 tablet by mouth at bedtime.         Marland Kitchen estradiol (ESTRACE) 1 MG tablet   Oral   Take 4 mg by mouth daily.         Marland Kitchen ibuprofen (ADVIL,MOTRIN) 600 MG tablet   Oral   Take 1 tablet (600 mg total) by mouth every 8 (eight) hours as needed.   15 tablet   0   . ibuprofen (ADVIL,MOTRIN) 800 MG tablet   Oral   Take 1 tablet (800 mg total) by mouth every 8 (eight) hours as needed.   30 tablet   0   . medroxyPROGESTERone (PROVERA) 10 MG tablet   Oral   Take 10 mg by mouth every morning.         Marland Kitchen spironolactone (ALDACTONE) 100 MG tablet   Oral   Take 100 mg by mouth every morning.           Allergies Penicillins  History reviewed. No pertinent family history.  Social History Social History  Substance  Use Topics  . Smoking status: Former Smoker    Types: Cigarettes  . Smokeless tobacco: None  . Alcohol Use: Yes     Comment: occas.     Review of Systems Constitutional: No fever/chills Eyes: No visual changes. ENT: No sore throat. Cardiovascular: Denies chest pain. Respiratory: Denies shortness of breath. Positive cough. Gastrointestinal: No abdominal pain.  No nausea, no vomiting.  No diarrhea.   Musculoskeletal: Negative for back pain. Skin: Negative for rash. Neurological: Negative for headaches, focal weakness or numbness.  10-point ROS otherwise negative.  ____________________________________________   PHYSICAL EXAM:  VITAL SIGNS: ED Triage Vitals  Enc Vitals Group     BP 07/31/15 2211 143/66 mmHg     Pulse Rate 07/31/15 2211 94     Resp 07/31/15 2211 22     Temp 07/31/15 2211 98.1 F (36.7 C)     Temp Source 07/31/15 2211 Oral     SpO2 07/31/15 2211 100 %     Weight 07/31/15 2211 160 lb (72.576 kg)     Height 07/31/15 2211 5\' 6"  (1.676 m)     Head Cir --      Peak Flow --      Pain Score 07/31/15 2211 10     Pain Loc  --      Pain Edu? --      Excl. in GC? --     Constitutional: Alert and oriented. Well appearing and in no acute distress. Eyes: Conjunctivae are normal. PERRL. EOMI. Head: Atraumatic. Nose: Mild congestion/no rhinnorhea.   EACs are clear bilaterally and TMs are clear as well bilaterally. Mouth/Throat: Mucous membranes are moist.  Oropharynx non-erythematous. Positive posterior drainage. Neck: No stridor.  Supple Hematological/Lymphatic/Immunilogical: No cervical lymphadenopathy. Cardiovascular: Normal rate, regular rhythm. Grossly normal heart sounds.  Good peripheral circulation. Respiratory: Normal respiratory effort.  No retractions. Lungs CTAB. Gastrointestinal: Soft and nontender. No distention.  Musculoskeletal: No lower extremity tenderness nor edema.  No joint effusions. Neurologic:  Normal speech and language. No gross focal neurologic deficits are appreciated. No gait instability. Skin:  Skin is warm, dry and intact. No rash noted. Psychiatric: Mood and affect are normal. Speech and behavior are normal.  ____________________________________________   LABS (all labs ordered are listed, but only abnormal results are displayed)  Labs Reviewed - No data to display  PROCEDURES  Procedure(s) performed: None  Critical Care performed: No  ____________________________________________   INITIAL IMPRESSION / ASSESSMENT AND PLAN / ED COURSE  Pertinent labs & imaging results that were available during my care of the patient were reviewed by me and considered in my medical decision making (see chart for details).  Lab work and chest x-ray was reviewed from results are noted today. Patient was given a prescription for Zithromax and Tessalon Perles as needed for cough. Patient is follow-up with his PCP if any continued problems. ____________________________________________   FINAL CLINICAL IMPRESSION(S) / ED DIAGNOSES  Final diagnoses:  Acute upper respiratory infection       Tommi RumpsRhonda L Lakeyn Dokken, PA-C 07/31/15 2338  Loleta Roseory Forbach, MD 07/31/15 [phone]

## 2015-12-11 ENCOUNTER — Emergency Department
Admission: EM | Admit: 2015-12-11 | Discharge: 2015-12-11 | Disposition: A | Discharge status: Home / Self Care | Payer: Medicaid Other | Attending: Emergency Medicine | Admitting: Emergency Medicine

## 2015-12-11 ENCOUNTER — Encounter: Payer: Self-pay | Admitting: Emergency Medicine

## 2015-12-11 DIAGNOSIS — Z7982 Long term (current) use of aspirin: Secondary | ICD-10-CM | POA: Insufficient documentation

## 2015-12-11 DIAGNOSIS — Z791 Long term (current) use of non-steroidal anti-inflammatories (NSAID): Secondary | ICD-10-CM | POA: Diagnosis not present

## 2015-12-11 DIAGNOSIS — K0889 Other specified disorders of teeth and supporting structures: Secondary | ICD-10-CM | POA: Diagnosis present

## 2015-12-11 DIAGNOSIS — Z87891 Personal history of nicotine dependence: Secondary | ICD-10-CM | POA: Insufficient documentation

## 2015-12-11 DIAGNOSIS — K08409 Partial loss of teeth, unspecified cause, unspecified class: Secondary | ICD-10-CM | POA: Diagnosis not present

## 2015-12-11 DIAGNOSIS — Z79899 Other long term (current) drug therapy: Secondary | ICD-10-CM | POA: Insufficient documentation

## 2015-12-11 MED ORDER — LIDOCAINE-EPINEPHRINE 2 %-1:100000 IJ SOLN
1.7000 mL | Freq: Once | INTRAMUSCULAR | Status: AC
  Administered 2015-12-11: 1.7 mL
  Filled 2015-12-11: qty 1.7

## 2015-12-11 MED ORDER — OXYCODONE-ACETAMINOPHEN 5-325 MG PO TABS: 1.0000 | ORAL_TABLET | Freq: Three times a day (TID) | ORAL | Status: DC | PRN

## 2015-12-11 NOTE — Discharge Instructions (Signed)
Dental Care and Dentist Visits Dental care supports good overall health. Regular dental visits can also help you avoid dental pain, bleeding, infection, and other more serious health problems in the future. It is important to keep the mouth healthy because diseases in the teeth, gums, and other oral tissues can spread to other areas of the body. Some problems, such as diabetes, heart disease, and pre-term labor have been associated with poor oral health.  See your dentist every 6 months. If you experience emergency problems such as a toothache or broken tooth, go to the dentist right away. If you see your dentist regularly, you may catch problems early. It is easier to be treated for problems in the early stages.  WHAT TO EXPECT AT A DENTIST VISIT  Your dentist will look for many common oral health problems and recommend proper treatment. At your regular dental visit, you can expect:  Gentle cleaning of the teeth and gums. This includes scraping and polishing. This helps to remove the sticky substance around the teeth and gums (plaque). Plaque forms in the mouth shortly after eating. Over time, plaque hardens on the teeth as tartar. If tartar is not removed regularly, it can cause problems. Cleaning also helps remove stains.  Periodic X-rays. These pictures of the teeth and supporting bone will help your dentist assess the health of your teeth.  Periodic fluoride treatments. Fluoride is a natural mineral shown to help strengthen teeth. Fluoride treatmentinvolves applying a fluoride gel or varnish to the teeth. It is most commonly done in children.  Examination of the mouth, tongue, jaws, teeth, and gums to look for any oral health problems, such as:  Cavities (dental caries). This is decay on the tooth caused by plaque, sugar, and acid in the mouth. It is best to catch a cavity when it is small.  Inflammation of the gums caused by plaque buildup (gingivitis).  Problems with the mouth or malformed  or misaligned teeth.  Oral cancer or other diseases of the soft tissues or jaws. KEEP YOUR TEETH AND GUMS HEALTHY For healthy teeth and gums, follow these general guidelines as well as your dentist's specific advice:  Have your teeth professionally cleaned at the dentist every 6 months.  Brush twice daily with a fluoride toothpaste.  Floss your teeth daily.  Ask your dentist if you need fluoride supplements, treatments, or fluoride toothpaste.  Eat a healthy diet. Reduce foods and drinks with added sugar.  Avoid smoking. TREATMENT FOR ORAL HEALTH PROBLEMS If you have oral health problems, treatment varies depending on the conditions present in your teeth and gums.  Your caregiver will most likely recommend good oral hygiene at each visit.  For cavities, gingivitis, or other oral health disease, your caregiver will perform a procedure to treat the problem. This is typically done at a separate appointment. Sometimes your caregiver will refer you to another dental specialist for specific tooth problems or for surgery. SEEK IMMEDIATE DENTAL CARE IF:  You have pain, bleeding, or soreness in the gum, tooth, jaw, or mouth area.  A permanent tooth becomes loose or separated from the gum socket.  You experience a blow or injury to the mouth or jaw area.   This information is not intended to replace advice given to you by your health care provider. Make sure you discuss any questions you have with your health care provider.   Document Released: 04/03/2011 Document Revised: 10/14/2011 Document Reviewed: 04/03/2011 Elsevier Interactive Patient Education [employer]   Follow-up with  your dental provider as discussed.

## 2015-12-11 NOTE — ED Notes (Signed)
Pt states she had a tooth pulled Monday. Pain on L side of face and in jaw. Pt states she took a percocet, states it helped pain for 10 minutes. States she was given an antibiotic, states it made her have diarrhea. This RN explained to pt that is a side effect.

## 2015-12-11 NOTE — ED Notes (Signed)
Dental pain since Monday.

## 2015-12-14 NOTE — ED Provider Notes (Signed)
[facility] Emergency Department Provider Note ____________________________________________  Time seen: 2033  I have reviewed the triage vital signs and the nursing notes.  HISTORY  Chief Complaint  Dental Pain  HPI Jonathan Deleon is a 45 y.o. male, transgendered, who prefers to be called "Jonathan Deleon," visits to the ED for dental pain following a dental extraction on Monday. She had a molar removedfrom the lower right jaw on Monday. She reports continued pain. She denies interim fevers, chills, sweats, or risk of dry socket. He notes nausea and vomiting with the antibiotic. She reports pain at 10/10 at triage.   No past medical history on file.  There are no active problems to display for this patient.  Past Surgical History  Procedure Laterality Date  . Brain surgery      Current Outpatient Rx  Name  Route  Sig  Dispense  Refill  . aspirin 325 MG tablet   Oral   Take 325 mg by mouth daily.         Marland Kitchen. azithromycin (ZITHROMAX Z-PAK) 250 MG tablet      Take 2 tablets (500 mg) on  Day 1,  followed by 1 tablet (250 mg) once daily on Days 2 through 5.   6 each   0   . benzonatate (TESSALON PERLES) 100 MG capsule   Oral   Take 1 capsule (100 mg total) by mouth 3 (three) times daily as needed for cough.   30 capsule   0   . BIOTIN PO   Oral   Take 2 tablets by mouth daily.          . cetirizine (ZYRTEC) 5 MG tablet   Oral   Take 10 mg by mouth daily.         . cyclobenzaprine (FLEXERIL) 10 MG tablet   Oral   Take 1 tablet (10 mg total) by mouth every 8 (eight) hours as needed for muscle spasms.   30 tablet   1   . efavirenz-emtricitabine-tenofovir (ATRIPLA) 600-200-300 MG per tablet   Oral   Take 1 tablet by mouth at bedtime.         Marland Kitchen. estradiol (ESTRACE) 1 MG tablet   Oral   Take 4 mg by mouth daily.         Marland Kitchen. ibuprofen (ADVIL,MOTRIN) 600 MG tablet   Oral   Take 1 tablet (600 mg total) by mouth every 8 (eight) hours as  needed.   15 tablet   0   . ibuprofen (ADVIL,MOTRIN) 800 MG tablet   Oral   Take 1 tablet (800 mg total) by mouth every 8 (eight) hours as needed.   30 tablet   0   . oxyCODONE-acetaminophen (ROXICET) 5-325 MG tablet   Oral   Take 1 tablet by mouth every 8 (eight) hours as needed for moderate pain or severe pain.   10 tablet   0   . spironolactone (ALDACTONE) 100 MG tablet   Oral   Take 100 mg by mouth every morning.          Allergies Penicillins  No family history on file.  Social History Social History  Substance Use Topics  . Smoking status: Former Smoker    Types: Cigarettes  . Smokeless tobacco: None  . Alcohol Use: Yes     Comment: occas.    Review of Systems  Constitutional: Negative for fever. Eyes: Negative for visual changes. ENT: Negative for sore throat. Dental pain as above Respiratory:  Negative for shortness of breath. Skin: Negative for rash. Neurological: Negative for headaches, focal weakness or numbness. ____________________________________________  PHYSICAL EXAM:  VITAL SIGNS: ED Triage Vitals  Enc Vitals Group     BP 12/11/15 1826 126/74 mmHg     Pulse Rate 12/11/15 1825 80     Resp 12/11/15 1825 18     Temp 12/11/15 1825 98.2 F (36.8 C)     Temp Source 12/11/15 1825 Oral     SpO2 12/11/15 1825 94 %     Weight 12/11/15 1825 160 lb (72.576 kg)     Height 12/11/15 1825  (1.727 m)     Head Cir --      Peak Flow --      Pain Score 12/11/15 1826 10     Pain Loc --      Pain Edu? --      Excl. in GC? --    Constitutional: Alert and oriented. Well appearing and in no distress. Head: Normocephalic and atraumatic.      Eyes: Conjunctivae are normal. PERRL. Normal extraocular movements      Ears: Canals clear. TMs intact bilaterally.   Nose: No congestion/rhinorrhea.   Mouth/Throat: Mucous membranes are moist. Uvula is midline and tonsils are flat. Right lower molar s/p extraction. Socket is moist and receding, without  evidence of dry socket.    Neck: Supple. No thyromegaly. Hematological/Lymphatic/Immunological: No cervical lymphadenopathy. Cardiovascular: Normal rate, regular rhythm.  Respiratory: Normal respiratory effort. No wheezes/rales/rhonchi. Musculoskeletal: Nontender with normal range of motion in all extremities.  ____________________________________________  DENTAL BLOCK  Performed by: Lissa Hoard Consent: Verbal consent obtained. Required items: devices and special equipment available Time out: Immediately prior to procedure a "time out" was called to verify the correct patient, procedure, equipment, support staff and site/side marked as required.  Indication: pain Nerve block body site: lower left jaw  Preparation: Patient was prepped and draped in the usual sterile fashion. Needle gauge: 27 G Location technique: anatomical landmarks  Local anesthetic: lidocaine-EPINEPHrine (XYLOCAINE W/EPI) 2 %-1:100000   Anesthetic total: 1.0 ml  Outcome: pain improved Patient tolerance: Patient tolerated the procedure well with no immediate complications. ____________________________________________  INITIAL IMPRESSION / ASSESSMENT AND PLAN / ED COURSE  Acute dental pain status post extraction. Patient is advised to follow with her dental provider as directed. She will be advised to continue with his previous a prescribed antibiotic. Also will be discharged with appropriate for #10 Percocet for pain relief. She is advised that no further prescriptions will be provided the the ED for dental pain. She should continue with post extraction care as directed. ____________________________________________  FINAL CLINICAL IMPRESSION(S) / ED DIAGNOSES  Final diagnoses:  Pain, dental  H/O tooth extraction, unspecified edentulism      Lissa Hoard, PA-C 12/14/15 1910  Sharman Cheek, MD 12/15/15 [phone]

## 2016-02-05 ENCOUNTER — Encounter: Payer: Self-pay | Admitting: Emergency Medicine

## 2016-02-05 ENCOUNTER — Emergency Department
Admission: EM | Admit: 2016-02-05 | Discharge: 2016-02-05 | Disposition: A | Discharge status: Home / Self Care | Payer: Medicaid Other | Attending: Emergency Medicine | Admitting: Emergency Medicine

## 2016-02-05 ENCOUNTER — Emergency Department: Payer: Medicaid Other

## 2016-02-05 DIAGNOSIS — F329 Major depressive disorder, single episode, unspecified: Secondary | ICD-10-CM | POA: Insufficient documentation

## 2016-02-05 DIAGNOSIS — Z87891 Personal history of nicotine dependence: Secondary | ICD-10-CM | POA: Diagnosis not present

## 2016-02-05 DIAGNOSIS — Z7982 Long term (current) use of aspirin: Secondary | ICD-10-CM | POA: Diagnosis not present

## 2016-02-05 DIAGNOSIS — Z21 Asymptomatic human immunodeficiency virus [HIV] infection status: Secondary | ICD-10-CM | POA: Diagnosis not present

## 2016-02-05 DIAGNOSIS — K6289 Other specified diseases of anus and rectum: Secondary | ICD-10-CM | POA: Insufficient documentation

## 2016-02-05 DIAGNOSIS — Z8673 Personal history of transient ischemic attack (TIA), and cerebral infarction without residual deficits: Secondary | ICD-10-CM | POA: Diagnosis not present

## 2016-02-05 DIAGNOSIS — R109 Unspecified abdominal pain: Secondary | ICD-10-CM | POA: Diagnosis present

## 2016-02-05 LAB — COMPREHENSIVE METABOLIC PANEL
ALBUMIN: 4.3 g/dL (ref 3.5–5.0)
ALT: 24 U/L (ref 17–63)
AST: 25 U/L (ref 15–41)
Alkaline Phosphatase: 79 U/L (ref 38–126)
Anion gap: 7 (ref 5–15)
BILIRUBIN TOTAL: 0.4 mg/dL (ref 0.3–1.2)
BUN: 23 mg/dL — AB (ref 6–20)
CHLORIDE: 104 mmol/L (ref 101–111)
CO2: 24 mmol/L (ref 22–32)
Calcium: 8.8 mg/dL — ABNORMAL LOW (ref 8.9–10.3)
Creatinine, Ser: 1.15 mg/dL (ref 0.61–1.24)
GFR calc Af Amer: >60 mL/min (ref >60)
GFR calc non Af Amer: >60 mL/min (ref >60)
GLUCOSE: 119 mg/dL — AB (ref 65–99)
POTASSIUM: 4.1 mmol/L (ref 3.5–5.1)
Sodium: 135 mmol/L (ref 135–145)
TOTAL PROTEIN: 7.8 g/dL (ref 6.5–8.1)

## 2016-02-05 LAB — LIPASE, BLOOD: Lipase: 39 U/L (ref 11–51)

## 2016-02-05 LAB — CBC
HEMATOCRIT: 41.6 % (ref 40.0–52.0)
Hemoglobin: 14 g/dL (ref 13.0–18.0)
MCH: 29.3 pg (ref 26.0–34.0)
MCHC: 33.7 g/dL (ref 32.0–36.0)
MCV: 86.8 fL (ref 80.0–100.0)
Platelets: 244 10*3/uL (ref 150–440)
RBC: 4.8 MIL/uL (ref 4.40–5.90)
RDW: 13.9 % (ref 11.5–14.5)
WBC: 6.8 10*3/uL (ref 3.8–10.6)

## 2016-02-05 LAB — URINALYSIS COMPLETE WITH MICROSCOPIC (ARMC ONLY)
Bilirubin Urine: NEGATIVE
GLUCOSE, UA: NEGATIVE mg/dL
Hgb urine dipstick: NEGATIVE
Ketones, ur: NEGATIVE mg/dL
Leukocytes, UA: NEGATIVE
Nitrite: NEGATIVE
Protein, ur: NEGATIVE mg/dL
RBC / HPF: NONE SEEN RBC/hpf (ref 0–5)
Specific Gravity, Urine: 1.021 (ref 1.005–1.030)
pH: 5 (ref 5.0–8.0)

## 2016-02-05 MED ORDER — HYDROCODONE-ACETAMINOPHEN 5-325 MG PO TABS
1.0000 | ORAL_TABLET | Freq: Once | ORAL | Status: AC
  Administered 2016-02-05: 1 via ORAL

## 2016-02-05 MED ORDER — IOPAMIDOL (ISOVUE-300) INJECTION 61%
100.0000 mL | Freq: Once | INTRAVENOUS | Status: AC | PRN
  Administered 2016-02-05: 100 mL via INTRAVENOUS

## 2016-02-05 MED ORDER — HYDROCODONE-ACETAMINOPHEN 5-325 MG PO TABS
ORAL_TABLET | ORAL | Status: AC
  Administered 2016-02-05: 1 via ORAL
  Filled 2016-02-05: qty 1

## 2016-02-05 MED ORDER — MORPHINE SULFATE (PF) 4 MG/ML IV SOLN
INTRAVENOUS | Status: AC
  Administered 2016-02-05: 4 mg via INTRAVENOUS
  Filled 2016-02-05: qty 1

## 2016-02-05 MED ORDER — SODIUM CHLORIDE 0.9 % IV SOLN: Freq: Once | INTRAVENOUS | Status: DC

## 2016-02-05 MED ORDER — ONDANSETRON HCL 4 MG/2ML IJ SOLN
INTRAMUSCULAR | Status: AC
  Administered 2016-02-05: 4 mg via INTRAVENOUS
  Filled 2016-02-05: qty 2

## 2016-02-05 MED ORDER — SODIUM CHLORIDE 0.9 % IV SOLN: 5.0000 mg/kg | Freq: Two times a day (BID) | INTRAVENOUS | Status: DC

## 2016-02-05 MED ORDER — MORPHINE SULFATE (PF) 4 MG/ML IV SOLN
4.0000 mg | Freq: Once | INTRAVENOUS | Status: AC
  Administered 2016-02-05: 4 mg via INTRAVENOUS

## 2016-02-05 MED ORDER — GI COCKTAIL ~~LOC~~
30.0000 mL | Freq: Once | ORAL | Status: AC
  Administered 2016-02-05: 30 mL via ORAL
  Filled 2016-02-05: qty 30

## 2016-02-05 MED ORDER — DIATRIZOATE MEGLUMINE & SODIUM 66-10 % PO SOLN: 15.0000 mL | Freq: Once | ORAL | Status: DC

## 2016-02-05 MED ORDER — ONDANSETRON HCL 4 MG/2ML IJ SOLN
4.0000 mg | Freq: Once | INTRAMUSCULAR | Status: AC
  Administered 2016-02-05: 4 mg via INTRAVENOUS
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
## 2016-02-05 NOTE — ED Notes (Signed)
Pt has finished po contrast. Ct called and notified.

## 2016-02-05 NOTE — ED Notes (Signed)
Pt requesting additional pain medication. Order for zofran and morphine received from dr. Darnelle CatalanMalinda.

## 2016-02-05 NOTE — Discharge Instructions (Signed)
Abdominal Pain, Adult Many things can cause abdominal pain. Usually, abdominal pain is not caused by a disease and will improve without treatment. It can often be observed and treated at home. Your health care provider will do a physical exam and possibly order blood tests and X-rays to help determine the seriousness of your pain. However, in many cases, more time must pass before a clear cause of the pain can be found. Before that point, your health care provider may not know if you need more testing or further treatment. HOME CARE INSTRUCTIONS Monitor your abdominal pain for any changes. The following actions may help to alleviate any discomfort you are experiencing:  Only take over-the-counter or prescription medicines as directed by your health care provider.  Do not take laxatives unless directed to do so by your health care provider.  Try a clear liquid diet (broth, tea, or water) as directed by your health care provider. Slowly move to a bland diet as tolerated. SEEK MEDICAL CARE IF:  You have unexplained abdominal pain.  You have abdominal pain associated with nausea or diarrhea.  You have pain when you urinate or have a bowel movement.  You experience abdominal pain that wakes you in the night.  You have abdominal pain that is worsened or improved by eating food.  You have abdominal pain that is worsened with eating fatty foods.  You have a fever. SEEK IMMEDIATE MEDICAL CARE IF:  Your pain does not go away within 2 hours.  You keep throwing up (vomiting).  Your pain is felt only in portions of the abdomen, such as the right side or the left lower portion of the abdomen.  You pass bloody or black tarry stools. MAKE SURE YOU:  Understand these instructions.  Will watch your condition.  Will get help right away if you are not doing well or get worse.   This information is not intended to replace advice given to you by your health care provider. Make sure you discuss  any questions you have with your health care provider.   Document Released: 05/01/2005 Document Revised: 04/12/2015 Document Reviewed: 03/31/2013 Elsevier Interactive Patient Education [employer]   It would be better if you stayed in the hospital but since you cannot please return if you're any worse. That includes pain in the rectal area fever vomiting or feeling sicker. I will give you to Vicodin pills she can take 1 every 4-6 hours to help with the abdominal pain if you needed. Please follow-up with your doctor one way or the other by Wednesday. Again please remember to return if you have any further problems or get worse.

## 2016-02-05 NOTE — ED Notes (Signed)
Pt says she had abd pain after drinking "white liquor" last week; pain subsided and returned after drinking "brown liquor"; pt c/o pain to left side of her abd and center of abd; pain worsens after eating; changed diet in hopes of helping her stomach but it did not help; pt very chatty in triage

## 2016-02-05 NOTE — ED Notes (Signed)
Pt to ct scan.

## 2016-02-05 NOTE — ED Notes (Signed)
Report to jill, rn.  

## 2016-02-05 NOTE — ED Notes (Addendum)
Pt declines to change into gown, pt texting on phone during exam, does not make eye contact with rn. Pt states "i think i got bit by a bug in [place] today and that got my stomach to hurting." pt denies nausea, vomiting, fever, diarrhea. Pt states last bowel movement approx 1 hour before pain began. Pt states is not able to urinate at this time. Pt declines offer for warm blanket. Skin normal color warm and dry. Pt describes pain to abdomen as central, around umbilicus, then describes pain as luq, then describes pain as llq, it is difficulty during assessment to pinpoint exactly where pt has abd pain and when it began. Pt relates has been alternating between brown and white liquor "because it's summer girl" to alleviate abd pain, and has changed diet to alleviate abdominal pain without altering pain. Pt relates pain began today, then relates pain may have begun before today.

## 2016-02-05 NOTE — ED Notes (Signed)
Blankets provided for pt's visitor. Pt and visitor updated on md wait time.

## 2016-02-05 NOTE — ED Notes (Signed)
Pt declines vs. Pt updated on ct scan wait time. Pt states "they need to hurry the fuck up, i got places i need to go."

## 2016-02-15 ENCOUNTER — Encounter: Payer: Self-pay | Admitting: *Deleted

## 2016-02-15 ENCOUNTER — Emergency Department
Admission: EM | Admit: 2016-02-15 | Discharge: 2016-02-15 | Disposition: A | Discharge status: Home / Self Care | Payer: Medicaid Other | Attending: Emergency Medicine | Admitting: Emergency Medicine

## 2016-02-15 DIAGNOSIS — Z87891 Personal history of nicotine dependence: Secondary | ICD-10-CM | POA: Diagnosis not present

## 2016-02-15 DIAGNOSIS — R197 Diarrhea, unspecified: Secondary | ICD-10-CM | POA: Diagnosis not present

## 2016-02-15 DIAGNOSIS — Z21 Asymptomatic human immunodeficiency virus [HIV] infection status: Secondary | ICD-10-CM | POA: Diagnosis not present

## 2016-02-15 DIAGNOSIS — R1013 Epigastric pain: Secondary | ICD-10-CM | POA: Diagnosis present

## 2016-02-15 DIAGNOSIS — Z7982 Long term (current) use of aspirin: Secondary | ICD-10-CM | POA: Diagnosis not present

## 2016-02-15 DIAGNOSIS — Z79899 Other long term (current) drug therapy: Secondary | ICD-10-CM | POA: Insufficient documentation

## 2016-02-15 DIAGNOSIS — F640 Transsexualism: Secondary | ICD-10-CM | POA: Insufficient documentation

## 2016-02-15 LAB — COMPREHENSIVE METABOLIC PANEL
ALBUMIN: 3.4 g/dL — AB (ref 3.5–5.0)
ALT: 18 U/L (ref 17–63)
ANION GAP: 5 (ref 5–15)
AST: 23 U/L (ref 15–41)
Alkaline Phosphatase: 71 U/L (ref 38–126)
BILIRUBIN TOTAL: 0.3 mg/dL (ref 0.3–1.2)
BUN: 14 mg/dL (ref 6–20)
CHLORIDE: 113 mmol/L — AB (ref 101–111)
CO2: 22 mmol/L (ref 22–32)
Calcium: 8.2 mg/dL — ABNORMAL LOW (ref 8.9–10.3)
Creatinine, Ser: 0.88 mg/dL (ref 0.61–1.24)
GFR calc Af Amer: >60 mL/min (ref >60)
GFR calc non Af Amer: >60 mL/min (ref >60)
GLUCOSE: 80 mg/dL (ref 65–99)
POTASSIUM: 4.1 mmol/L (ref 3.5–5.1)
SODIUM: 140 mmol/L (ref 135–145)
TOTAL PROTEIN: 6.4 g/dL — AB (ref 6.5–8.1)

## 2016-02-15 LAB — URINALYSIS COMPLETE WITH MICROSCOPIC (ARMC ONLY)
BACTERIA UA: NONE SEEN
Bilirubin Urine: NEGATIVE
Glucose, UA: NEGATIVE mg/dL
HGB URINE DIPSTICK: NEGATIVE
Ketones, ur: NEGATIVE mg/dL
LEUKOCYTES UA: NEGATIVE
NITRITE: NEGATIVE
PROTEIN: NEGATIVE mg/dL
RBC / HPF: NONE SEEN RBC/hpf (ref 0–5)
SPECIFIC GRAVITY, URINE: 1.016 (ref 1.005–1.030)
pH: 5 (ref 5.0–8.0)

## 2016-02-15 LAB — CBC
HEMATOCRIT: 39.3 % — AB (ref 40.0–52.0)
HEMOGLOBIN: 13.2 g/dL (ref 13.0–18.0)
MCH: 29.8 pg (ref 26.0–34.0)
MCHC: 33.6 g/dL (ref 32.0–36.0)
MCV: 88.5 fL (ref 80.0–100.0)
Platelets: 212 10*3/uL (ref 150–440)
RBC: 4.44 MIL/uL (ref 4.40–5.90)
RDW: 14.2 % (ref 11.5–14.5)
WBC: 4.9 10*3/uL (ref 3.8–10.6)

## 2016-02-15 LAB — LIPASE, BLOOD: LIPASE: 17 U/L (ref 11–51)

## 2016-02-15 MED ORDER — GI COCKTAIL ~~LOC~~
30.0000 mL | Freq: Once | ORAL | Status: AC
  Administered 2016-02-15: 30 mL via ORAL
  Filled 2016-02-15: qty 30

## 2016-02-15 NOTE — Discharge Instructions (Signed)
You were evaluated for ongoing watery stools, and upper abdominal pain. Although no certain cause was found, your examine evaluation are reassuring in the emergency department today.  As we discussed, he should follow with primary care physician, and I think a gastroenterologist at [facility] is probably the next best step.  Return to emergency department for any worsening pain, fever, black or bloody stools, concern for dehydration, dizziness or passing out, or any other symptoms concerning to you.   Diarrhea Diarrhea is frequent loose and watery bowel movements. It can cause you to feel weak and dehydrated. Dehydration can cause you to become tired and thirsty, have a dry mouth, and have decreased urination that often is dark yellow. Diarrhea is a sign of another problem, most often an infection that will not last long. In most cases, diarrhea typically lasts 2-3 days. However, it can last longer if it is a sign of something more serious. It is important to treat your diarrhea as directed by your caregiver to lessen or prevent future episodes of diarrhea. CAUSES  Some common causes include:  Gastrointestinal infections caused by viruses, bacteria, or parasites.  Food poisoning or food allergies.  Certain medicines, such as antibiotics, chemotherapy, and laxatives.  Artificial sweeteners and fructose.  Digestive disorders. HOME CARE INSTRUCTIONS  Ensure adequate fluid intake (hydration): Have 1 cup (8 oz) of fluid for each diarrhea episode. Avoid fluids that contain simple sugars or sports drinks, fruit juices, whole milk products, and sodas. Your urine should be clear or pale yellow if you are drinking enough fluids. Hydrate with an oral rehydration solution that you can purchase at pharmacies, retail stores, and online. You can prepare an oral rehydration solution at home by mixing the following ingredients together:   - tsp table salt.   tsp baking soda.   tsp salt substitute containing  potassium chloride.  1  tablespoons sugar.  1 L (34 oz) of water.  Certain foods and beverages may increase the speed at which food moves through the gastrointestinal (GI) tract. These foods and beverages should be avoided and include:  Caffeinated and alcoholic beverages.  High-fiber foods, such as raw fruits and vegetables, nuts, seeds, and whole grain breads and cereals.  Foods and beverages sweetened with sugar alcohols, such as xylitol, sorbitol, and mannitol.  Some foods may be well tolerated and may help thicken stool including:  Starchy foods, such as rice, toast, pasta, low-sugar cereal, oatmeal, grits, baked potatoes, crackers, and bagels.  Bananas.  Applesauce.  Add probiotic-rich foods to help increase healthy bacteria in the GI tract, such as yogurt and fermented milk products.  Wash your hands well after each diarrhea episode.  Only take over-the-counter or prescription medicines as directed by your caregiver.  Take a warm bath to relieve any burning or pain from frequent diarrhea episodes. SEEK IMMEDIATE MEDICAL CARE IF:   You are unable to keep fluids down.  You have persistent vomiting.  You have blood in your stool, or your stools are black and tarry.  You do not urinate in 6-8 hours, or there is only a small amount of very dark urine.  You have abdominal pain that increases or localizes.  You have weakness, dizziness, confusion, or light-headedness.  You have a severe headache.  Your diarrhea gets worse or does not get better.  You have a fever or persistent symptoms for more than 2-3 days.  You have a fever and your symptoms suddenly get worse. MAKE SURE YOU:   Understand these instructions.  Will watch your condition.  Will get help right away if you are not doing well or get worse.   This information is not intended to replace advice given to you by your health care provider. Make sure you discuss any questions you have with your health  care provider.   Document Released: 07/12/2002 Document Revised: 08/12/2014 Document Reviewed: 03/29/2012 Elsevier Interactive Patient Education 2016 Elsevier Inc. Gastritis, Adult Gastritis is soreness and swelling (inflammation) of the lining of the stomach. Gastritis can develop as a sudden onset (acute) or long-term (chronic) condition. If gastritis is not treated, it can lead to stomach bleeding and ulcers. CAUSES  Gastritis occurs when the stomach lining is weak or damaged. Digestive juices from the stomach then inflame the weakened stomach lining. The stomach lining may be weak or damaged due to viral or bacterial infections. One common bacterial infection is the Helicobacter pylori infection. Gastritis can also result from excessive alcohol consumption, taking certain medicines, or having too much acid in the stomach.  SYMPTOMS  In some cases, there are no symptoms. When symptoms are present, they may include:  Pain or a burning sensation in the upper abdomen.  Nausea.  Vomiting.  An uncomfortable feeling of fullness after eating. DIAGNOSIS  Your caregiver may suspect you have gastritis based on your symptoms and a physical exam. To determine the cause of your gastritis, your caregiver may perform the following:  Blood or stool tests to check for the H pylori bacterium.  Gastroscopy. A thin, flexible tube (endoscope) is passed down the esophagus and into the stomach. The endoscope has a light and camera on the end. Your caregiver uses the endoscope to view the inside of the stomach.  Taking a tissue sample (biopsy) from the stomach to examine under a microscope. TREATMENT  Depending on the cause of your gastritis, medicines may be prescribed. If you have a bacterial infection, such as an H pylori infection, antibiotics may be given. If your gastritis is caused by too much acid in the stomach, H2 blockers or antacids may be given. Your caregiver may recommend that you stop taking  aspirin, ibuprofen, or other nonsteroidal anti-inflammatory drugs (NSAIDs). HOME CARE INSTRUCTIONS  Only take over-the-counter or prescription medicines as directed by your caregiver.  If you were given antibiotic medicines, take them as directed. Finish them even if you start to feel better.  Drink enough fluids to keep your urine clear or pale yellow.  Avoid foods and drinks that make your symptoms worse, such as:  Caffeine or alcoholic drinks.  Chocolate.  Peppermint or mint flavorings.  Garlic and onions.  Spicy foods.  Citrus fruits, such as oranges, lemons, or limes.  Tomato-based foods such as sauce, chili, salsa, and pizza.  Fried and fatty foods.  Eat small, frequent meals instead of large meals. SEEK IMMEDIATE MEDICAL CARE IF:   You have black or dark red stools.  You vomit blood or material that looks like coffee grounds.  You are unable to keep fluids down.  Your abdominal pain gets worse.  You have a fever.  You do not feel better after 1 week.  You have any other questions or concerns. MAKE SURE YOU:  Understand these instructions.  Will watch your condition.  Will get help right away if you are not doing well or get worse.   This information is not intended to replace advice given to you by your health care provider. Make sure you discuss any questions you have with your health care  provider.   Document Released: 07/16/2001 Document Revised: 01/21/2012 Document Reviewed: 09/04/2011 Elsevier Interactive Patient Education [employer]
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
## 2016-02-15 NOTE — ED Notes (Signed)
States burning upon urination and nausea, states hx of proctitis with abx

## 2016-03-18 ENCOUNTER — Emergency Department: Payer: Medicaid Other

## 2016-03-18 ENCOUNTER — Observation Stay
Admission: EM | Admit: 2016-03-18 | Discharge: 2016-03-19 | Disposition: A | Discharge status: Home / Self Care | Payer: Medicaid Other | Attending: Specialist | Admitting: Specialist

## 2016-03-18 DIAGNOSIS — Y249XXS Unspecified firearm discharge, undetermined intent, sequela: Secondary | ICD-10-CM | POA: Diagnosis not present

## 2016-03-18 DIAGNOSIS — G9389 Other specified disorders of brain: Secondary | ICD-10-CM | POA: Insufficient documentation

## 2016-03-18 DIAGNOSIS — S0193XA Puncture wound without foreign body of unspecified part of head, initial encounter: Secondary | ICD-10-CM | POA: Insufficient documentation

## 2016-03-18 DIAGNOSIS — I10 Essential (primary) hypertension: Secondary | ICD-10-CM | POA: Insufficient documentation

## 2016-03-18 DIAGNOSIS — Z8673 Personal history of transient ischemic attack (TIA), and cerebral infarction without residual deficits: Secondary | ICD-10-CM | POA: Insufficient documentation

## 2016-03-18 DIAGNOSIS — Z87891 Personal history of nicotine dependence: Secondary | ICD-10-CM | POA: Diagnosis not present

## 2016-03-18 DIAGNOSIS — Z21 Asymptomatic human immunodeficiency virus [HIV] infection status: Secondary | ICD-10-CM | POA: Diagnosis not present

## 2016-03-18 DIAGNOSIS — Z7982 Long term (current) use of aspirin: Secondary | ICD-10-CM | POA: Insufficient documentation

## 2016-03-18 DIAGNOSIS — R569 Unspecified convulsions: Secondary | ICD-10-CM | POA: Diagnosis present

## 2016-03-18 DIAGNOSIS — R32 Unspecified urinary incontinence: Secondary | ICD-10-CM | POA: Insufficient documentation

## 2016-03-18 DIAGNOSIS — Z79899 Other long term (current) drug therapy: Secondary | ICD-10-CM | POA: Insufficient documentation

## 2016-03-18 DIAGNOSIS — Z833 Family history of diabetes mellitus: Secondary | ICD-10-CM | POA: Insufficient documentation

## 2016-03-18 DIAGNOSIS — Z880 Allergy status to penicillin: Secondary | ICD-10-CM | POA: Diagnosis not present

## 2016-03-18 DIAGNOSIS — Z809 Family history of malignant neoplasm, unspecified: Secondary | ICD-10-CM | POA: Insufficient documentation

## 2016-03-18 HISTORY — DX: Cerebral infarction, unspecified: I63.9

## 2016-03-18 HISTORY — DX: Unspecified convulsions: R56.9

## 2016-03-18 HISTORY — DX: Essential (primary) hypertension: I10

## 2016-03-18 LAB — CBC WITH DIFFERENTIAL/PLATELET
BASOS ABS: 0.1 10*3/uL (ref 0–0.1)
Basophils Relative: 1 %
Eosinophils Absolute: 0.1 10*3/uL (ref 0–0.7)
Eosinophils Relative: 1 %
HEMATOCRIT: 49 % (ref 40.0–52.0)
Hemoglobin: 15.5 g/dL (ref 13.0–18.0)
LYMPHS PCT: 44 %
Lymphs Abs: 4.7 10*3/uL — ABNORMAL HIGH (ref 1.0–3.6)
MCH: 28.9 pg (ref 26.0–34.0)
MCHC: 31.6 g/dL — ABNORMAL LOW (ref 32.0–36.0)
MCV: 91.4 fL (ref 80.0–100.0)
Monocytes Absolute: 0.9 10*3/uL (ref 0.2–1.0)
Monocytes Relative: 9 %
NEUTROS ABS: 5 10*3/uL (ref 1.4–6.5)
Neutrophils Relative %: 45 %
Platelets: 269 10*3/uL (ref 150–440)
RBC: 5.36 MIL/uL (ref 4.40–5.90)
RDW: 13.9 % (ref 11.5–14.5)
WBC: 10.9 10*3/uL — AB (ref 3.8–10.6)

## 2016-03-18 LAB — URINALYSIS COMPLETE WITH MICROSCOPIC (ARMC ONLY)
BACTERIA UA: NONE SEEN
BILIRUBIN URINE: NEGATIVE
GLUCOSE, UA: NEGATIVE mg/dL
Ketones, ur: NEGATIVE mg/dL
Leukocytes, UA: NEGATIVE
NITRITE: NEGATIVE
Protein, ur: 30 mg/dL — AB
RBC / HPF: NONE SEEN RBC/hpf (ref 0–5)
Specific Gravity, Urine: 1.016 (ref 1.005–1.030)
WBC UA: NONE SEEN WBC/hpf (ref 0–5)
pH: 5 (ref 5.0–8.0)

## 2016-03-18 LAB — BASIC METABOLIC PANEL
Anion gap: 18 — ABNORMAL HIGH (ref 5–15)
BUN: 18 mg/dL (ref 6–20)
CHLORIDE: 106 mmol/L (ref 101–111)
CO2: 10 mmol/L — ABNORMAL LOW (ref 22–32)
Calcium: 9.5 mg/dL (ref 8.9–10.3)
Creatinine, Ser: 1.23 mg/dL (ref 0.61–1.24)
GFR calc Af Amer: >60 mL/min (ref >60)
GFR calc non Af Amer: >60 mL/min (ref >60)
Glucose, Bld: 159 mg/dL — ABNORMAL HIGH (ref 65–99)
POTASSIUM: 4.2 mmol/L (ref 3.5–5.1)
SODIUM: 134 mmol/L — AB (ref 135–145)

## 2016-03-18 LAB — GLUCOSE, CAPILLARY: GLUCOSE-CAPILLARY: 166 mg/dL — AB (ref 65–99)

## 2016-03-18 LAB — URINE DRUG SCREEN, QUALITATIVE (ARMC ONLY)
AMPHETAMINES, UR SCREEN: NOT DETECTED
Barbiturates, Ur Screen: NOT DETECTED
Benzodiazepine, Ur Scrn: POSITIVE — AB
CANNABINOID 50 NG, UR ~~LOC~~: NOT DETECTED
COCAINE METABOLITE, UR ~~LOC~~: NOT DETECTED
MDMA (ECSTASY) UR SCREEN: NOT DETECTED
METHADONE SCREEN, URINE: NOT DETECTED
Opiate, Ur Screen: NOT DETECTED
Phencyclidine (PCP) Ur S: NOT DETECTED
TRICYCLIC, UR SCREEN: NOT DETECTED

## 2016-03-18 MED ORDER — ACETAMINOPHEN 650 MG RE SUPP: 650.0000 mg | Freq: Four times a day (QID) | RECTAL | Status: DC | PRN

## 2016-03-18 MED ORDER — ONDANSETRON HCL 4 MG/2ML IJ SOLN: 4.0000 mg | Freq: Four times a day (QID) | INTRAMUSCULAR | Status: DC | PRN

## 2016-03-18 MED ORDER — ENOXAPARIN SODIUM 40 MG/0.4ML ~~LOC~~ SOLN
40.0000 mg | Freq: Every day | SUBCUTANEOUS | Status: DC
  Administered 2016-03-19: 40 mg via SUBCUTANEOUS
  Filled 2016-03-18: qty 0.4

## 2016-03-18 MED ORDER — OXYCODONE HCL 5 MG PO TABS: 5.0000 mg | ORAL_TABLET | ORAL | Status: DC | PRN

## 2016-03-18 MED ORDER — EFAVIRENZ-EMTRICITAB-TENOFOVIR 600-200-300 MG PO TABS
1.0000 | ORAL_TABLET | Freq: Every day | ORAL | Status: DC
  Administered 2016-03-19: 1 via ORAL
  Filled 2016-03-18 (×2): qty 1

## 2016-03-18 MED ORDER — KETOROLAC TROMETHAMINE 30 MG/ML IJ SOLN
10.0000 mg | Freq: Once | INTRAMUSCULAR | Status: AC
  Administered 2016-03-18: 9.9 mg via INTRAVENOUS
  Filled 2016-03-18: qty 1

## 2016-03-18 MED ORDER — LEVETIRACETAM 500 MG PO TABS
500.0000 mg | ORAL_TABLET | Freq: Two times a day (BID) | ORAL | Status: DC
  Administered 2016-03-19 (×2): 500 mg via ORAL
  Filled 2016-03-18 (×2): qty 1

## 2016-03-18 MED ORDER — LORATADINE 10 MG PO TABS
10.0000 mg | ORAL_TABLET | Freq: Every day | ORAL | Status: DC
  Administered 2016-03-19: 10 mg via ORAL
  Filled 2016-03-18: qty 1

## 2016-03-18 MED ORDER — SODIUM CHLORIDE 0.9 % IV BOLUS (SEPSIS): 1000.0000 mL | Freq: Once | INTRAVENOUS | Status: DC

## 2016-03-18 MED ORDER — METOCLOPRAMIDE HCL 5 MG/ML IJ SOLN
10.0000 mg | Freq: Once | INTRAMUSCULAR | Status: AC
  Administered 2016-03-18: 10 mg via INTRAVENOUS
  Filled 2016-03-18: qty 2

## 2016-03-18 MED ORDER — SODIUM CHLORIDE 0.9 % IV BOLUS (SEPSIS)
1000.0000 mL | Freq: Once | INTRAVENOUS | Status: AC
  Administered 2016-03-18: 1000 mL via INTRAVENOUS

## 2016-03-18 MED ORDER — SPIRONOLACTONE 25 MG PO TABS
100.0000 mg | ORAL_TABLET | Freq: Every morning | ORAL | Status: DC
  Administered 2016-03-19: 10:00:00 100 mg via ORAL
  Filled 2016-03-18: qty 4

## 2016-03-18 MED ORDER — SODIUM CHLORIDE 0.9 % IV SOLN
1500.0000 mg | Freq: Once | INTRAVENOUS | Status: AC
  Administered 2016-03-18: 1500 mg via INTRAVENOUS
  Filled 2016-03-18: qty 15

## 2016-03-18 MED ORDER — BUTALBITAL-APAP-CAFFEINE 50-325-40 MG PO TABS: 1.0000 | ORAL_TABLET | ORAL | Status: DC | PRN

## 2016-03-18 MED ORDER — ONDANSETRON HCL 4 MG PO TABS: 4.0000 mg | ORAL_TABLET | Freq: Four times a day (QID) | ORAL | Status: DC | PRN

## 2016-03-18 MED ORDER — ACETAMINOPHEN 325 MG PO TABS: 650.0000 mg | ORAL_TABLET | Freq: Four times a day (QID) | ORAL | Status: DC | PRN

## 2016-03-18 MED ORDER — ASPIRIN 325 MG PO TABS
325.0000 mg | ORAL_TABLET | Freq: Every day | ORAL | Status: DC
  Administered 2016-03-19: 10:00:00 325 mg via ORAL
  Filled 2016-03-18: qty 1

## 2016-03-18 MED ORDER — ESTRADIOL 2 MG PO TABS
4.0000 mg | ORAL_TABLET | Freq: Every day | ORAL | Status: DC
  Administered 2016-03-19: 10:00:00 4 mg via ORAL
  Filled 2016-03-18: qty 2

## 2016-03-18 MED ORDER — ACETAMINOPHEN 325 MG PO TABS
650.0000 mg | ORAL_TABLET | Freq: Once | ORAL | Status: AC
  Administered 2016-03-18: 650 mg via ORAL
  Filled 2016-03-18: qty 2

## 2016-03-18 NOTE — ED Notes (Addendum)
Patient responds to pain but is unable to respond to questions and is impulsively removing sheet/gown.

## 2016-03-18 NOTE — ED Notes (Signed)
Pt. Has returned form CT scan.  Neuro status has improved and patient is now responding and is at baseline. MD notified. Called pharmacy abt Keppra. Will be sending asap.

## 2016-03-18 NOTE — ED Notes (Signed)
Patient indicated that headache is currently "a 12" on a scale of 1-10. RN informed.

## 2016-03-18 NOTE — ED Triage Notes (Signed)
Pt to ED via EMS. Patient was reported as having seizure while picking up a child at school. Hx of seizures but well controlled with medication. Last reported seizure per family was 4 years ago. Patient had a 2nd seizure in the ambulance while being transported. 2mg  of Versed given by EMS. Initial vitals 133hr, 128/70bp. Pt appears to have been incontinent, diaphoretic, unresponsive to verbal stimulation. Did not respnd to ammonia during seizure with EMS.
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
## 2016-03-18 NOTE — ED Notes (Signed)
Family member Jonathan JonesCarolyn requested to be called if pt. Room changes. Ph# [phone].
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
## 2016-03-19 DIAGNOSIS — R569 Unspecified convulsions: Secondary | ICD-10-CM

## 2016-03-19 MED ORDER — LEVETIRACETAM 500 MG PO TABS: 500.0000 mg | ORAL_TABLET | Freq: Two times a day (BID) | ORAL | 1 refills | Status: DC
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
## 2016-03-19 NOTE — Progress Notes (Signed)
Patient is d/ced home.  Admitted inpt after she had witnessed seizure.  Pt has had no seizures while in pt.  She will go home on Keppra bid.  Patient has order not to drive until seen and cleared by neurologist.  Explained to patient that her PCP has to refer her to neurologist.  Will be going home with aunt.  CNA removed her IV.  Tele d/ced.

## 2016-03-20 LAB — HIV-1 RNA QUANT-NO REFLEX-BLD
HIV 1 RNA Quant: <20 copies/mL
LOG10 HIV-1 RNA: UNDETERMINED log10copy/mL

## 2016-08-22 ENCOUNTER — Emergency Department
Admission: EM | Admit: 2016-08-22 | Discharge: 2016-08-22 | Disposition: A | Discharge status: Home / Self Care | Payer: Medicaid Other | Attending: Emergency Medicine | Admitting: Emergency Medicine

## 2016-08-22 ENCOUNTER — Encounter: Payer: Self-pay | Admitting: Emergency Medicine

## 2016-08-22 DIAGNOSIS — R05 Cough: Secondary | ICD-10-CM

## 2016-08-22 DIAGNOSIS — Z7982 Long term (current) use of aspirin: Secondary | ICD-10-CM | POA: Insufficient documentation

## 2016-08-22 DIAGNOSIS — Z87891 Personal history of nicotine dependence: Secondary | ICD-10-CM | POA: Insufficient documentation

## 2016-08-22 DIAGNOSIS — J0100 Acute maxillary sinusitis, unspecified: Secondary | ICD-10-CM | POA: Insufficient documentation

## 2016-08-22 DIAGNOSIS — Z79899 Other long term (current) drug therapy: Secondary | ICD-10-CM | POA: Diagnosis not present

## 2016-08-22 DIAGNOSIS — I10 Essential (primary) hypertension: Secondary | ICD-10-CM | POA: Diagnosis not present

## 2016-08-22 DIAGNOSIS — R059 Cough, unspecified: Secondary | ICD-10-CM

## 2016-08-22 MED ORDER — AZITHROMYCIN 250 MG PO TABS: ORAL_TABLET | ORAL | 0 refills | Status: AC

## 2016-08-22 NOTE — ED Provider Notes (Signed)
[facility] Emergency Department Provider Note  ____________________________________________  Time seen: Approximately 7:45 PM  I have reviewed the triage vital signs and the nursing notes.   HISTORY  Chief Complaint Cough and Facial Pain   HPI [place] A Wroe is a 46 y.o. male who presents to the emergency department for evaluation of cough, congestion, and facial pain for the past 3 weeks. She has not taken any medications prior to arrival. She denies fever.   Past Medical History:  Diagnosis Date  . Hypertension   . Seizures (HCC)   . Stroke [facility])     Patient Active Problem List   Diagnosis Date Noted  . Seizure (HCC) 03/18/2016    Past Surgical History:  Procedure Laterality Date  . BRAIN SURGERY      Prior to Admission medications   Medication Sig Start Date End Date Taking? Authorizing Provider  aspirin 325 MG tablet Take 325 mg by mouth daily.    Historical Provider, MD  azithromycin (ZITHROMAX Z-PAK) 250 MG tablet Take 2 tablets (500 mg) on  Day 1,  followed by 1 tablet (250 mg) once daily on Days 2 through 5. 08/22/16 08/27/16  Kaylie Ritter B Kroy Sprung, FNP  cetirizine (ZYRTEC) 5 MG tablet Take 10 mg by mouth daily. 06/01/15 05/31/16  Historical Provider, MD  efavirenz-emtricitabine-tenofovir (ATRIPLA) 600-200-300 MG per tablet Take 1 tablet by mouth at bedtime.    Historical Provider, MD  estradiol (ESTRACE) 1 MG tablet Take 4 mg by mouth daily.    Historical Provider, MD  levETIRAcetam (KEPPRA) 500 MG tablet Take 1 tablet (500 mg total) by mouth 2 (two) times daily. 03/19/16   Houston Siren, MD  spironolactone (ALDACTONE) 100 MG tablet Take 100 mg by mouth every morning. 09/14/14   Historical Provider, MD    Allergies Penicillins  History reviewed. No pertinent family history.  Social History Social History  Substance Use Topics  . Smoking status: Never Smoker  . Smokeless tobacco: Former Neurosurgeon  . Alcohol use Yes     Comment: occas.      Review of Systems Constitutional: Negative fever/chills ENT: Negative for sore throat. Cardiovascular: Denies chest pain. Respiratory: Negative shortness of breath. Positive for cough. Gastrointestinal: Negative for nausea,  Negative for vomiting.  No diarrhea.  Musculoskeletal: Negative for body aches Skin: Negative for rash. Neurological: Negative for headaches ____________________________________________   PHYSICAL EXAM:  VITAL SIGNS: ED Triage Vitals  Enc Vitals Group     BP 08/22/16 1648 123/66     Pulse Rate 08/22/16 1648 90     Resp 08/22/16 1648 15     Temp 08/22/16 1648 98.8 F (37.1 C)     Temp Source 08/22/16 1648 Oral     SpO2 08/22/16 1648 99 %     Weight 08/22/16 1648 160 lb (72.6 kg)     Height 08/22/16 1648 5\' 9"  (1.753 m)     Head Circumference --      Peak Flow --      Pain Score 08/22/16 1651 10     Pain Loc --      Pain Edu? --      Excl. in GC? --     Constitutional: Alert and oriented. Well appearing and in no acute distress. Eyes: Conjunctivae are normal. EOMI. Ears: Bilateral TM with serous fluid present, no otitis media. Nose: Maxillary sinus congestion; no rhinnorhea. Mouth/Throat: Mucous membranes are moist.  Oropharynx normal. Tonsils appear normal. Neck: No stridor.  Lymphatic: No cervical lymphadenopathy.  Cardiovascular: Normal rate, regular rhythm. Grossly normal heart sounds.  Good peripheral circulation. Respiratory: Normal respiratory effort.  No retractions. Breath sounds clear to auscultation. Gastrointestinal: Soft and nontender.  Musculoskeletal: FROM x 4 extremities.  Neurologic:  Normal speech and language.  Skin:  Skin is warm, dry and intact. No rash noted. Psychiatric: Mood and affect are normal. Speech and behavior are normal.  ____________________________________________   LABS (all labs ordered are listed, but only abnormal results are displayed)  Labs Reviewed - No data to  display ____________________________________________  EKG  Not indicated. ____________________________________________  RADIOLOGY  Not indicated. ____________________________________________   PROCEDURES  Procedure(s) performed: None  Critical Care performed: No  ____________________________________________   INITIAL IMPRESSION / ASSESSMENT AND PLAN / ED COURSE     Pertinent labs & imaging results that were available during my care of the patient were reviewed by me and considered in my medical decision making (see chart for details).  46 year old patient presents to the emergency department for evaluation of 3 weeks of sinus congestion, cough, and chest congestion. Prescriptions for azithromycin given tonight. Patient states that she has some Tessalon Perles at home which has worked in the past but has not taken since current symptoms began. She was advised to follow-up with the primary care provider for choice for symptoms that are not improving over the next several days or return to the emergency department for symptoms that change or worsen if unable schedule an appointment. ____________________________________________   FINAL CLINICAL IMPRESSION(S) / ED DIAGNOSES  Final diagnoses:  Acute maxillary sinusitis, recurrence not specified  Cough    Note:  This document was prepared using Dragon voice recognition software and may include unintentional dictation errors.     Chinita PesterCari B Malayah Demuro, FNP 08/22/16 [id number]    Jennye MoccasinBrian S Quigley, MD 08/23/16 [phone]

## 2016-08-22 NOTE — ED Notes (Signed)
Productive cough x 1.5 weeks. Denies fever, N&V. Diarrhea a few times. States pain from coughing in chest. States sinus pressure on face.

## 2016-08-22 NOTE — ED Triage Notes (Signed)
Pt c/o sinus congestion, cough, and chest congestion x 3 weeks. Pt states coughing up yellow/green sputum. Pt is alert and oriented at this time. NAD noted.

## 2016-08-22 NOTE — ED Notes (Signed)
Pt returned to room  

## 2016-08-22 NOTE — ED Notes (Signed)
Pt walked by nurses desk stating "I been here for 4 hours and I aint sitting back here anymore." Pt walked out to lobby with visitor. Provider updated.

## 2017-02-11 ENCOUNTER — Emergency Department
Admission: EM | Admit: 2017-02-11 | Discharge: 2017-02-11 | Disposition: A | Discharge status: Home / Self Care | Payer: Medicaid Other | Attending: Emergency Medicine | Admitting: Emergency Medicine

## 2017-02-11 ENCOUNTER — Emergency Department: Payer: Medicaid Other

## 2017-02-11 ENCOUNTER — Encounter: Payer: Self-pay | Admitting: Emergency Medicine

## 2017-02-11 DIAGNOSIS — X58XXXA Exposure to other specified factors, initial encounter: Secondary | ICD-10-CM | POA: Diagnosis not present

## 2017-02-11 DIAGNOSIS — Z7982 Long term (current) use of aspirin: Secondary | ICD-10-CM | POA: Insufficient documentation

## 2017-02-11 DIAGNOSIS — T1592XA Foreign body on external eye, part unspecified, left eye, initial encounter: Secondary | ICD-10-CM

## 2017-02-11 DIAGNOSIS — Y999 Unspecified external cause status: Secondary | ICD-10-CM | POA: Diagnosis not present

## 2017-02-11 DIAGNOSIS — R0789 Other chest pain: Secondary | ICD-10-CM | POA: Diagnosis not present

## 2017-02-11 DIAGNOSIS — Z79818 Long term (current) use of other agents affecting estrogen receptors and estrogen levels: Secondary | ICD-10-CM | POA: Insufficient documentation

## 2017-02-11 DIAGNOSIS — R079 Chest pain, unspecified: Secondary | ICD-10-CM | POA: Diagnosis present

## 2017-02-11 DIAGNOSIS — Z79899 Other long term (current) drug therapy: Secondary | ICD-10-CM | POA: Diagnosis not present

## 2017-02-11 DIAGNOSIS — Z87891 Personal history of nicotine dependence: Secondary | ICD-10-CM | POA: Insufficient documentation

## 2017-02-11 DIAGNOSIS — Y929 Unspecified place or not applicable: Secondary | ICD-10-CM | POA: Insufficient documentation

## 2017-02-11 DIAGNOSIS — I10 Essential (primary) hypertension: Secondary | ICD-10-CM | POA: Insufficient documentation

## 2017-02-11 DIAGNOSIS — Z8673 Personal history of transient ischemic attack (TIA), and cerebral infarction without residual deficits: Secondary | ICD-10-CM | POA: Insufficient documentation

## 2017-02-11 DIAGNOSIS — Y939 Activity, unspecified: Secondary | ICD-10-CM | POA: Insufficient documentation

## 2017-02-11 LAB — CBC
HCT: 41.2 % (ref 40.0–52.0)
HEMOGLOBIN: 13.7 g/dL (ref 13.0–18.0)
MCH: 28.6 pg (ref 26.0–34.0)
MCHC: 33.3 g/dL (ref 32.0–36.0)
MCV: 85.9 fL (ref 80.0–100.0)
PLATELETS: 233 10*3/uL (ref 150–440)
RBC: 4.79 MIL/uL (ref 4.40–5.90)
RDW: 13.8 % (ref 11.5–14.5)
WBC: 5.1 10*3/uL (ref 3.8–10.6)

## 2017-02-11 LAB — BASIC METABOLIC PANEL
ANION GAP: 7 (ref 5–15)
BUN: 17 mg/dL (ref 6–20)
CO2: 24 mmol/L (ref 22–32)
CREATININE: 0.91 mg/dL (ref 0.61–1.24)
Calcium: 8.9 mg/dL (ref 8.9–10.3)
Chloride: 105 mmol/L (ref 101–111)
GFR calc non Af Amer: >60 mL/min (ref >60)
Glucose, Bld: 94 mg/dL (ref 65–99)
Potassium: 3.9 mmol/L (ref 3.5–5.1)
SODIUM: 136 mmol/L (ref 135–145)

## 2017-02-11 LAB — TROPONIN I

## 2017-02-11 MED ORDER — FLUORESCEIN SODIUM 0.6 MG OP STRP
ORAL_STRIP | OPHTHALMIC | Status: AC
  Filled 2017-02-11: qty 1

## 2017-02-11 MED ORDER — TETRACAINE HCL 0.5 % OP SOLN
OPHTHALMIC | Status: AC
  Filled 2017-02-11: qty 4

## 2017-02-11 MED ORDER — TETRACAINE HCL 0.5 % OP SOLN: 1.0000 [drp] | Freq: Once | OPHTHALMIC | Status: DC

## 2017-02-11 MED ORDER — FLUORESCEIN SODIUM 0.6 MG OP STRP: 1.0000 | ORAL_STRIP | Freq: Once | OPHTHALMIC | Status: DC

## 2017-02-11 NOTE — Discharge Instructions (Signed)
Please follow-up with your primary care physician as needed and return to the emergency department for any concerns.  It was a pleasure to take care of you today, and thank you for coming to our emergency department.  If you have any questions or concerns before leaving please ask the nurse to grab me and I'm more than happy to go through your aftercare instructions again.  If you were prescribed any opioid pain medication today such as Norco, Vicodin, Percocet, morphine, hydrocodone, or oxycodone please make sure you do not drive when you are taking this medication as it can alter your ability to drive safely.  If you have any concerns once you are home that you are not improving or are in fact getting worse before you can make it to your follow-up appointment, please do not hesitate to call 911 and come back for further evaluation.  Merrily BrittleNeil Anoushka Divito MD  Results for orders placed or performed during the hospital encounter of 02/11/17  Basic metabolic panel  Result Value Ref Range   Sodium 136 135 - 145 mmol/L   Potassium 3.9 3.5 - 5.1 mmol/L   Chloride 105 101 - 111 mmol/L   CO2 24 22 - 32 mmol/L   Glucose, Bld 94 65 - 99 mg/dL   BUN 17 6 - 20 mg/dL   Creatinine, Ser 1.610.91 0.61 - 1.24 mg/dL   Calcium 8.9 8.9 - 09.610.3 mg/dL   GFR calc non Af Amer >60 >60 mL/min   GFR calc Af Amer >60 >60 mL/min   Anion gap 7 5 - 15  CBC  Result Value Ref Range   WBC 5.1 3.8 - 10.6 K/uL   RBC 4.79 4.40 - 5.90 MIL/uL   Hemoglobin 13.7 13.0 - 18.0 g/dL   HCT 04.541.2 40.940.0 - 81.152.0 %   MCV 85.9 80.0 - 100.0 fL   MCH 28.6 26.0 - 34.0 pg   MCHC 33.3 32.0 - 36.0 g/dL   RDW 91.413.8 78.211.5 - 95.614.5 %   Platelets 233 150 - 440 K/uL  Troponin I  Result Value Ref Range   Troponin I <0.03 <0.03 ng/mL   Dg Chest 2 View  Result Date: 02/11/2017 CLINICAL DATA:  Chest pain EXAM: CHEST  2 VIEW COMPARISON:  07/31/2015 FINDINGS: Normal heart size and mediastinal contours. No acute infiltrate or edema. No effusion or pneumothorax.  No acute osseous findings. IMPRESSION: Negative chest Electronically Signed   By: Marnee SpringJonathon  Watts M.D.   On: 02/11/2017 16:50

## 2017-02-11 NOTE — ED Notes (Signed)
Pt was outside, returned, pt was discharged, RN arrived pt back

## 2017-02-11 NOTE — ED Triage Notes (Signed)
Patient reports chest pain starting this morning with mild SOB. Denies dizziness, N/V. States pain is less now. Also reports pain and drainage to left eye x1 week. NAD noted at this time.

## 2017-02-11 NOTE — ED Provider Notes (Signed)
[facility] Emergency Department Provider Note  ____________________________________________   First MD Initiated Contact with Patient 02/11/17 1958     (approximate)  I have reviewed the triage vital signs and the nursing notes.   HISTORY  Chief Complaint Chest Pain and Eye Pain    HPI Jonathan Deleon is a 46 y.o. male to male transgender woman who self presents to the emergency department with 2 issues. Yesterday she was lifting weights and working out when she normally does not and this morning when she awoke she had to 2 second episode of sharp "laser like" chest pain that began just below her left breast and up to her left shoulder. Not associated with shortness of breath. Nonexertional. It is not recurred. She also reports roughly 1 week of an itching uncomfortable sensation in her left eye which she feels may have an eyelash in it. No change in her vision.   Past Medical History:  Diagnosis Date  . Hypertension   . Seizures (HCC)   . Stroke [facility])     Patient Active Problem List   Diagnosis Date Noted  . Seizure (HCC) 03/18/2016    Past Surgical History:  Procedure Laterality Date  . BRAIN SURGERY      Prior to Admission medications   Medication Sig Start Date End Date Taking? Authorizing Provider  aspirin 325 MG tablet Take 325 mg by mouth daily.    [provider]  cetirizine (ZYRTEC) 5 MG tablet Take 10 mg by mouth daily. 06/01/15 05/31/16  [provider]  efavirenz-emtricitabine-tenofovir (ATRIPLA) 600-200-300 MG per tablet Take 1 tablet by mouth at bedtime.    [provider]  estradiol (ESTRACE) 1 MG tablet Take 4 mg by mouth daily.    [provider]  levETIRAcetam (KEPPRA) 500 MG tablet Take 1 tablet (500 mg total) by mouth 2 (two) times daily. 03/19/16   Houston Siren, MD  spironolactone (ALDACTONE) 100 MG tablet Take 100 mg by mouth every morning. 09/14/14   [provider]     Allergies Penicillins  No family history on file.  Social History Social History  Substance Use Topics  . Smoking status: Never Smoker  . Smokeless tobacco: Former Neurosurgeon  . Alcohol use Yes     Comment: occas.     Review of Systems Constitutional: No fever/chills Eyes: Positive visual changes. ENT: No sore throat. Cardiovascular: Positive chest pain. Respiratory: Denies shortness of breath. Gastrointestinal: No abdominal pain.  No nausea, no vomiting.  No diarrhea.  No constipation. Genitourinary: Negative for dysuria. Musculoskeletal: Negative for back pain. Skin: Negative for rash. Neurological: Negative for headaches, focal weakness or numbness.   ____________________________________________   PHYSICAL EXAM:  VITAL SIGNS: ED Triage Vitals  Enc Vitals Group     BP 02/11/17 1619 121/78     Pulse Rate 02/11/17 1619 76     Resp 02/11/17 1619 20     Temp 02/11/17 1619 98.5 F (36.9 C)     Temp Source 02/11/17 1619 Oral     SpO2 02/11/17 1619 98 %     Weight 02/11/17 1620 160 lb (72.6 kg)     Height 02/11/17 1620 5\' 9"  (1.753 m)     Head Circumference --      Peak Flow --      Pain Score 02/11/17 1627 7     Pain Loc --      Pain Edu? --      Excl. in GC? --  Constitutional: Alert and oriented 4 well appearing Eyes: PERRL EOMI. 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 Nose: No congestion/rhinnorhea. Mouth/Throat: No trismus Neck: No stridor.   Cardiovascular: Normal rate, regular rhythm. Grossly normal heart sounds.  Good peripheral circulation. Respiratory: Normal respiratory effort.  No retractions. Lungs CTAB and moving good air Gastrointestinal: Soft nontender Musculoskeletal: No lower extremity edema   Neurologic:  Normal speech and language. No gross focal neurologic deficits are appreciated. Skin:  Skin  is warm, dry and intact. No rash noted. Psychiatric: Mood and affect are normal. Speech and behavior are normal.    ____________________________________________   DIFFERENTIAL includes but not limited to  Corneal abrasion, corneal ulcer, conjunctivitis, musculoskeletal pain ____________________________________________   LABS (all labs ordered are listed, but only abnormal results are displayed)  Labs Reviewed  BASIC METABOLIC PANEL  CBC  TROPONIN I    No signs of acute ischemia __________________________________________  EKG  ED ECG REPORT I, Merrily BrittleNeil Parneet Glantz, the attending physician, personally viewed and interpreted this ECG.  Date: 02/11/2017 EKG Time:  Rate: 72 Rhythm: normal sinus rhythm QRS Axis: normal Intervals: normal ST/T Wave abnormalities: normal Narrative Interpretation: unremarkable  ____________________________________________  RADIOLOGY  Chest x-ray with no acute disease ____________________________________________   PROCEDURES  Procedure(s) performed: no  Procedures  Critical Care performed: no  Observation: no ____________________________________________   INITIAL IMPRESSION / ASSESSMENT AND PLAN / ED COURSE  Pertinent labs & imaging results that were available during my care of the patient were reviewed by me and considered in my medical decision making (see chart for details).  The patient is extremely atypical chest pain that does not warrant further investigation. Her pain in her eyes completely resolved after tetracaine although I was not able to identify any foreign body. I irrigated her eye with a copious amount of normal saline and hopefully and was able to remove the foreign body. She has no worsening uptake and no indication for antibiotics. She is discharged home in improved condition.      ____________________________________________   FINAL CLINICAL IMPRESSION(S) / ED DIAGNOSES  Final diagnoses:  Atypical chest  pain  Foreign body of left eye, initial encounter      NEW MEDICATIONS STARTED DURING THIS VISIT:  Discharge Medication List as of 02/11/2017  8:21 PM       Note:  This document was prepared using Dragon voice recognition software and may include unintentional dictation errors.     Merrily Brittleifenbark, Jak Haggar, MD 02/11/17 2310

## 2017-02-11 NOTE — ED Notes (Signed)
Pt called in lobby with no response 

## 2017-02-11 NOTE — ED Notes (Signed)
Pt called in lobby, no response 

## 2017-02-11 NOTE — ED Notes (Signed)
Esign not working. Pt verbalized discharge instructions and has not questions at this time.

## 2017-05-04 ENCOUNTER — Emergency Department: Payer: Medicaid Other

## 2017-05-04 ENCOUNTER — Emergency Department
Admission: EM | Admit: 2017-05-04 | Discharge: 2017-05-04 | Disposition: A | Discharge status: Home / Self Care | Payer: Medicaid Other | Attending: Emergency Medicine | Admitting: Emergency Medicine

## 2017-05-04 DIAGNOSIS — I10 Essential (primary) hypertension: Secondary | ICD-10-CM | POA: Diagnosis not present

## 2017-05-04 DIAGNOSIS — Z79899 Other long term (current) drug therapy: Secondary | ICD-10-CM | POA: Insufficient documentation

## 2017-05-04 DIAGNOSIS — R52 Pain, unspecified: Secondary | ICD-10-CM

## 2017-05-04 DIAGNOSIS — G44309 Post-traumatic headache, unspecified, not intractable: Secondary | ICD-10-CM | POA: Diagnosis not present

## 2017-05-04 DIAGNOSIS — M79675 Pain in left toe(s): Secondary | ICD-10-CM | POA: Diagnosis present

## 2017-05-04 DIAGNOSIS — Z8673 Personal history of transient ischemic attack (TIA), and cerebral infarction without residual deficits: Secondary | ICD-10-CM | POA: Insufficient documentation

## 2017-05-04 DIAGNOSIS — Z7982 Long term (current) use of aspirin: Secondary | ICD-10-CM | POA: Diagnosis not present

## 2017-05-04 DIAGNOSIS — K5901 Slow transit constipation: Secondary | ICD-10-CM | POA: Diagnosis not present

## 2017-05-04 DIAGNOSIS — S0990XS Unspecified injury of head, sequela: Secondary | ICD-10-CM

## 2017-05-04 MED ORDER — LEVETIRACETAM 500 MG PO TABS: 500.0000 mg | ORAL_TABLET | Freq: Two times a day (BID) | ORAL | 1 refills | Status: DC

## 2017-05-04 NOTE — ED Triage Notes (Addendum)
Patient arrives to [facility] ed with c/o left great toe pain, atraumatic in nature.   Ambulatory in triage

## 2017-05-04 NOTE — Discharge Instructions (Signed)
Your exam and x-rays are essentially normal at this time. Your toe pain is likely due to repeated trauma (stubbing  your toe). Take ibuprofen as needed. You should restart your Keppra immediately for management of your headaches and prevention of seizures. You should also start a daily stool softener (Miralax) as well as a fiber supplement. Eating higher fiber foods will also help your symptoms. Follow-up with neurology for ongoing management. Return to the ED as needed.

## 2017-05-04 NOTE — ED Provider Notes (Signed)
[facility] Emergency Department Provider Note ____________________________________________  Time seen: 1903  I have reviewed the triage vital signs and the nursing notes.  HISTORY  Chief Complaint  Toe Pain; Abdominal Pain; and Cough  HPI Jonathan Deleon is a 46 y.o. transgender male presents to the ED for evaluation of multiple complaints. Primary complaint is left great toe pain after a pedicure about 2 weeks prior. She also admits to stubbing her toesome time after that. She note pain with pushing off. She also notes abdominal pain and bloating. She notes she has a stool after douching (enema) daily. There is also some for intermittent cough and wheezing over the last week. She denies fevers, chills, sweats, nausea, hematochezia, or rectal bleeding..   Past Medical History:  Diagnosis Date  . Hypertension   . Seizures (HCC)   . Stroke [facility])     Patient Active Problem List   Diagnosis Date Noted  . Seizure (HCC) 03/18/2016    Past Surgical History:  Procedure Laterality Date  . BRAIN SURGERY      Prior to Admission medications   Medication Sig Start Date End Date Taking? Authorizing Provider  aspirin 325 MG tablet Take 325 mg by mouth daily.    [provider]  cetirizine (ZYRTEC) 5 MG tablet Take 10 mg by mouth daily. 06/01/15 05/31/16  [provider]  efavirenz-emtricitabine-tenofovir (ATRIPLA) 600-200-300 MG per tablet Take 1 tablet by mouth at bedtime.    [provider]  estradiol (ESTRACE) 1 MG tablet Take 4 mg by mouth daily.    [provider]  levETIRAcetam (KEPPRA) 500 MG tablet Take 1 tablet (500 mg total) by mouth 2 (two) times daily. 03/19/16   Houston Siren, MD  levETIRAcetam (KEPPRA) 500 MG tablet Take 1 tablet (500 mg total) by mouth 2 (two) times daily. 05/04/17   Quinntin Malter, Charlesetta Ivory, PA-C  spironolactone (ALDACTONE) 100 MG tablet Take 100 mg by mouth every morning. 09/14/14   [provider]    Allergies Penicillins  No family history on file.  Social History Social History  Substance Use Topics  . Smoking status: Never Smoker  . Smokeless tobacco: Former Neurosurgeon  . Alcohol use Yes     Comment: occas.     Review of Systems  Constitutional: Negative for fever. Cardiovascular: Negative for chest pain. Respiratory: Negative for shortness of breath. Reports intermittent cough and wheezing for the last week. Gastrointestinal: Negative for abdominal pain, vomiting and diarrhea. Reports bloating, gas, and constipation Genitourinary: Negative for dysuria. Musculoskeletal: Negative for back pain. Left great toe pain for the last two weeks.  Skin: Negative for rash. Neurological: Negative for headaches, focal weakness or numbness. ____________________________________________  PHYSICAL EXAM:  VITAL SIGNS: ED Triage Vitals  Enc Vitals Group     BP 05/04/17 1822 119/81     Pulse Rate 05/04/17 1822 81     Resp 05/04/17 1822 18     Temp 05/04/17 1822 98.6 F (37 C)     Temp Source 05/04/17 1822 Oral     SpO2 05/04/17 1822 100 %     Weight --      Height --      Head Circumference --      Peak Flow --      Pain Score 05/04/17 1820 7     Pain Loc --      Pain Edu? --      Excl. in GC? --     Constitutional:  Alert and oriented. Well appearing and in no distress. Head: Normocephalic and atraumatic. Cardiovascular: Normal rate, regular rhythm. Normal distal pulses. Respiratory: Normal respiratory effort. No wheezes/rales/rhonchi. Gastrointestinal: Soft and nontender. Mildly distended, but nontender with normal bowel sounds. No organomegaly, rigidity, or rebound. Musculoskeletal: Left toe without any obvious deformity, dislocation, or paronychia. Patient tender to palpation to the plantar surface. Pain with dorsiflexion.Nontender with normal range of motion in all extremities.  Neurologic:  Normal gait without ataxia. Normal speech and language. No gross  focal neurologic deficits are appreciated. Skin:  Skin is warm, dry and intact. No rash noted. ____________________________________________   RADIOLOGY  Left Great Toe  IMPRESSION: Negative.  CXR  IMPRESSION: No active cardiopulmonary disease  ABD Upright  IMPRESSION: No evidence bowel obstruction or ileus.  I, Roe Koffman, Charlesetta Ivory, personally viewed and evaluated these images (plain radiographs) as part of my medical decision making, as well as reviewing the written report by the radiologist. ____________________________________________  INITIAL IMPRESSION / ASSESSMENT AND PLAN / ED COURSE  Patient with ED evaluation of multiple complaints. She complains of left toe pain, which is consistent with a mild turf toe, following negative x-rays. Her abdominal films show stool with a non-obstructive gas pattern. She is advised to use to use Miralax daily, and occasional magnesium citrate. A high fiber diet is also recommended. The chest x-ray is negative at this time. A prescription for Keppra is rewritten due the recurrent headaches. No recent seizure activity is reported, but a referral to neurology is again provided.  ____________________________________________  FINAL CLINICAL IMPRESSION(S) / ED DIAGNOSES  Final diagnoses:  Pain of left great toe  Constipation due to slow transit  Headaches due to old head injury      Karmen Stabs, Charlesetta Ivory, PA-C 05/05/17 0000    Dionne Bucy, MD 05/05/17 [phone]

## 2017-06-17 ENCOUNTER — Other Ambulatory Visit: Payer: Self-pay | Admitting: "Endocrinology

## 2017-06-17 DIAGNOSIS — F640 Transsexualism: Secondary | ICD-10-CM

## 2017-06-17 DIAGNOSIS — Z789 Other specified health status: Secondary | ICD-10-CM

## 2017-06-29 ENCOUNTER — Emergency Department
Admission: EM | Admit: 2017-06-29 | Discharge: 2017-06-30 | Disposition: A | Discharge status: Home / Self Care | Payer: Medicaid Other | Attending: Emergency Medicine | Admitting: Emergency Medicine

## 2017-06-29 ENCOUNTER — Other Ambulatory Visit: Payer: Self-pay

## 2017-06-29 DIAGNOSIS — Z87891 Personal history of nicotine dependence: Secondary | ICD-10-CM | POA: Diagnosis not present

## 2017-06-29 DIAGNOSIS — Z8673 Personal history of transient ischemic attack (TIA), and cerebral infarction without residual deficits: Secondary | ICD-10-CM | POA: Diagnosis not present

## 2017-06-29 DIAGNOSIS — J069 Acute upper respiratory infection, unspecified: Secondary | ICD-10-CM | POA: Diagnosis not present

## 2017-06-29 DIAGNOSIS — H6983 Other specified disorders of Eustachian tube, bilateral: Secondary | ICD-10-CM | POA: Diagnosis not present

## 2017-06-29 DIAGNOSIS — J029 Acute pharyngitis, unspecified: Secondary | ICD-10-CM | POA: Diagnosis not present

## 2017-06-29 DIAGNOSIS — Z79899 Other long term (current) drug therapy: Secondary | ICD-10-CM | POA: Insufficient documentation

## 2017-06-29 DIAGNOSIS — Z7982 Long term (current) use of aspirin: Secondary | ICD-10-CM | POA: Insufficient documentation

## 2017-06-29 DIAGNOSIS — R05 Cough: Secondary | ICD-10-CM | POA: Diagnosis not present

## 2017-06-29 DIAGNOSIS — B349 Viral infection, unspecified: Secondary | ICD-10-CM | POA: Diagnosis not present

## 2017-06-29 DIAGNOSIS — I10 Essential (primary) hypertension: Secondary | ICD-10-CM | POA: Diagnosis not present

## 2017-06-29 DIAGNOSIS — R0981 Nasal congestion: Secondary | ICD-10-CM | POA: Diagnosis present

## 2017-06-29 DIAGNOSIS — B9789 Other viral agents as the cause of diseases classified elsewhere: Secondary | ICD-10-CM

## 2017-06-29 LAB — POCT RAPID STREP A: Streptococcus, Group A Screen (Direct): NEGATIVE

## 2017-06-29 MED ORDER — HYDROCOD POLST-CPM POLST ER 10-8 MG/5ML PO SUER
5.0000 mL | Freq: Once | ORAL | Status: AC
  Administered 2017-06-29: 5 mL via ORAL
  Filled 2017-06-29: qty 5

## 2017-06-29 MED ORDER — MAGIC MOUTHWASH
10.0000 mL | Freq: Once | ORAL | Status: AC
  Administered 2017-06-29: 10 mL via ORAL
  Filled 2017-06-29: qty 10

## 2017-06-29 MED ORDER — METHYLPREDNISOLONE 4 MG PO TBPK: ORAL_TABLET | ORAL | 0 refills | Status: DC

## 2017-06-29 MED ORDER — ALBUTEROL SULFATE HFA 108 (90 BASE) MCG/ACT IN AERS: 2.0000 | INHALATION_SPRAY | RESPIRATORY_TRACT | 0 refills | Status: DC | PRN

## 2017-06-29 MED ORDER — AZITHROMYCIN 500 MG PO TABS
500.0000 mg | ORAL_TABLET | Freq: Once | ORAL | Status: AC
  Administered 2017-06-30: 500 mg via ORAL
  Filled 2017-06-29: qty 1

## 2017-06-29 MED ORDER — AZITHROMYCIN 250 MG PO TABS: 250.0000 mg | ORAL_TABLET | Freq: Every day | ORAL | 0 refills | Status: DC

## 2017-06-29 MED ORDER — HYDROCOD POLST-CPM POLST ER 10-8 MG/5ML PO SUER: 5.0000 mL | Freq: Two times a day (BID) | ORAL | 0 refills | Status: DC

## 2017-06-29 MED ORDER — MAGIC MOUTHWASH: 5.0000 mL | Freq: Three times a day (TID) | ORAL | 0 refills | Status: DC | PRN

## 2017-06-29 NOTE — Discharge Instructions (Signed)
1.  Finish antibiotic as prescribed. 2.  Take Medrol Dosepak as directed. 3.  You may take Magic mouthwash as needed for throat discomfort. 4.  You may take Tussionex as needed for cough. 5.  Use albuterol inhaler 2 puffs every 4 hours as needed for difficulty breathing. 6.  Return to the ER for worsening symptoms, persistent vomiting, difficulty breathing or other concerns.

## 2017-06-29 NOTE — ED Provider Notes (Signed)
[facility] Emergency Department Provider Note   ____________________________________________   First MD Initiated Contact with Patient 06/29/17 2304     (approximate)  I have reviewed the triage vital signs and the nursing notes.   HISTORY  Chief Complaint Nasal Congestion    HPI Jonathan Deleon is a 46 y.o. adult who presents to the ED from home with a chief complaint of cough, congestion, bilateral ear pain and sore throat.  Symptoms x 3 days. + sick contacts.  Denies fever, chills, chest pain, shortness of breath, abdominal pain, nausea or vomiting.  Denies recent travel or trauma.   Past Medical History:  Diagnosis Date  . Hypertension   . Seizures (HCC)   . Stroke [facility](HCC)     Patient Active Problem List   Diagnosis Date Noted  . Seizure (HCC) 03/18/2016    Past Surgical History:  Procedure Laterality Date  . BRAIN SURGERY      Prior to Admission medications   Medication Sig Start Date End Date Taking? Authorizing Provider  albuterol (PROVENTIL HFA;VENTOLIN HFA) 108 (90 Base) MCG/ACT inhaler Inhale 2 puffs into the lungs every 4 (four) hours as needed for wheezing or shortness of breath. 06/29/17   Irean HongSung, Jade J, MD  aspirin 325 MG tablet Take 325 mg by mouth daily.    [provider]  azithromycin (ZITHROMAX) 250 MG tablet Take 1 tablet (250 mg total) by mouth daily. 06/29/17   Irean HongSung, Jade J, MD  cetirizine (ZYRTEC) 5 MG tablet Take 10 mg by mouth daily. 06/01/15 05/31/16  [provider]  chlorpheniramine-HYDROcodone (TUSSIONEX PENNKINETIC ER) 10-8 MG/5ML SUER Take 5 mLs by mouth 2 (two) times daily. 06/29/17   Irean HongSung, Jade J, MD  efavirenz-emtricitabine-tenofovir (ATRIPLA) 600-200-300 MG per tablet Take 1 tablet by mouth at bedtime.    [provider]  estradiol (ESTRACE) 1 MG tablet Take 4 mg by mouth daily.    [provider]  levETIRAcetam (KEPPRA) 500 MG tablet Take 1 tablet (500 mg total) by mouth 2  (two) times daily. 03/19/16   Houston SirenSainani, Vivek J, MD  levETIRAcetam (KEPPRA) 500 MG tablet Take 1 tablet (500 mg total) by mouth 2 (two) times daily. 05/04/17   Menshew, Charlesetta IvoryJenise V Bacon, PA-C  magic mouthwash SOLN Take 5 mLs by mouth 3 (three) times daily as needed for mouth pain. 06/29/17   Irean HongSung, Jade J, MD  methylPREDNISolone (MEDROL DOSEPAK) 4 MG TBPK tablet Take as directed 06/29/17   Irean HongSung, Jade J, MD  spironolactone (ALDACTONE) 100 MG tablet Take 100 mg by mouth every morning. 09/14/14   [provider]    Allergies Penicillins  No family history on file.  Social History Social History   Tobacco Use  . Smoking status: Never Smoker  . Smokeless tobacco: Former Engineer, waterUser  Substance Use Topics  . Alcohol use: Yes    Comment: occas.   . Drug use: No    Review of Systems  Constitutional: No fever/chills. Eyes: No visual changes. ENT: Positive for bilateral ear pain and sore throat. Cardiovascular: Denies chest pain. Respiratory: Positive for cough productive of clear sputum.  Denies shortness of breath. Gastrointestinal: No abdominal pain.  No nausea, no vomiting.  No diarrhea.  No constipation. Genitourinary: Negative for dysuria. Musculoskeletal: Negative for back pain. Skin: Negative for rash. Neurological: Negative for headaches, focal weakness or numbness.   ____________________________________________   PHYSICAL EXAM:  VITAL SIGNS: ED Triage Vitals [06/29/17 2253]  Enc Vitals Group  BP 121/80     Pulse Rate 81     Resp 18     Temp 98.7 F (37.1 C)     Temp Source Oral     SpO2 100 %     Weight 165 lb (74.8 kg)     Height 5\' 9"  (1.753 m)     Head Circumference      Peak Flow      Pain Score 10     Pain Loc      Pain Edu?      Excl. in GC?     Constitutional: Alert and oriented. Well appearing and in no acute distress. Eyes: Conjunctivae are normal. PERRL. EOMI. Head: Atraumatic. Ears: Fluid behind bilateral TMs without perforation or  erythema. Nose: Congestion/rhinnorhea. Mouth/Throat: Mucous membranes are moist.  Oropharynx mildly erythematous without tonsillar swelling, exudates or peritonsillar abscess. Neck: No stridor.  Supple neck without meningismus. Hematological/Lymphatic/Immunilogical:Shotty anterior cervical lymphadenopathy. Cardiovascular: Normal rate, regular rhythm. Grossly normal heart sounds.  Good peripheral circulation. Respiratory: Normal respiratory effort.  No retractions. Lungs CTAB. Gastrointestinal: Soft and nontender. No distention. No abdominal bruits. No CVA tenderness. Musculoskeletal: No lower extremity tenderness nor edema.  No joint effusions. Neurologic:  Normal speech and language. No gross focal neurologic deficits are appreciated. No gait instability. Skin:  Skin is warm, dry and intact. No rash noted. No petechiae. Psychiatric: Mood and affect are normal. Speech and behavior are normal.  ____________________________________________   LABS (all labs ordered are listed, but only abnormal results are displayed)  Labs Reviewed - No data to display ____________________________________________  EKG  None ____________________________________________  RADIOLOGY  No results found.  ____________________________________________   PROCEDURES  Procedure(s) performed: None  Procedures  Critical Care performed: No  ____________________________________________   INITIAL IMPRESSION / ASSESSMENT AND PLAN / ED COURSE  As part of my medical decision making, I reviewed the following data within the electronic MEDICAL RECORD NUMBER Nursing notes reviewed and incorporated, Labs reviewed  and Notes from prior ED visits.   46 year old male who associates herself as male presenting with URI symptoms and sore throat. RST negative.  Will treat with azithromycin, Medrol Dosepak for eustachian tube dysfunction, Tussionex for cough and Magic mouthwash for sore throat.  Prescription for  albuterol inhaler provided at patient's request.  Strict return precautions given.  Patient verbalize understanding and agrees with plan of care.      ____________________________________________   FINAL CLINICAL IMPRESSION(S) / ED DIAGNOSES  Final diagnoses:  Viral URI with cough  Pharyngitis, unspecified etiology  Dysfunction of both eustachian tubes     ED Discharge Orders        Ordered    magic mouthwash SOLN  3 times daily PRN     06/29/17 2334    methylPREDNISolone (MEDROL DOSEPAK) 4 MG TBPK tablet     06/29/17 2334    chlorpheniramine-HYDROcodone (TUSSIONEX PENNKINETIC ER) 10-8 MG/5ML SUER  2 times daily     06/29/17 2334    azithromycin (ZITHROMAX) 250 MG tablet  Daily     06/29/17 2334    albuterol (PROVENTIL HFA;VENTOLIN HFA) 108 (90 Base) MCG/ACT inhaler  Every 4 hours PRN     06/29/17 2335       Note:  This document was prepared using Dragon voice recognition software and may include unintentional dictation errors.    Irean HongSung, Jade J, MD 06/30/17 [phone]

## 2017-06-29 NOTE — ED Triage Notes (Signed)
Pt states cough and congestion with mucous production for the past 3 days

## 2017-06-30 MED ORDER — KETOROLAC TROMETHAMINE 30 MG/ML IJ SOLN
60.0000 mg | Freq: Once | INTRAMUSCULAR | Status: AC
  Administered 2017-06-30: 60 mg via INTRAMUSCULAR
  Filled 2017-06-30: qty 2

## 2017-07-31 ENCOUNTER — Encounter: Payer: Self-pay | Admitting: Intensive Care

## 2017-07-31 ENCOUNTER — Emergency Department: Payer: Medicaid Other

## 2017-07-31 ENCOUNTER — Emergency Department
Admission: EM | Admit: 2017-07-31 | Discharge: 2017-07-31 | Disposition: A | Discharge status: Home / Self Care | Payer: Medicaid Other | Attending: Emergency Medicine | Admitting: Emergency Medicine

## 2017-07-31 DIAGNOSIS — Z7982 Long term (current) use of aspirin: Secondary | ICD-10-CM | POA: Diagnosis not present

## 2017-07-31 DIAGNOSIS — Z8673 Personal history of transient ischemic attack (TIA), and cerebral infarction without residual deficits: Secondary | ICD-10-CM | POA: Insufficient documentation

## 2017-07-31 DIAGNOSIS — J0100 Acute maxillary sinusitis, unspecified: Secondary | ICD-10-CM | POA: Insufficient documentation

## 2017-07-31 DIAGNOSIS — I10 Essential (primary) hypertension: Secondary | ICD-10-CM | POA: Insufficient documentation

## 2017-07-31 DIAGNOSIS — M79672 Pain in left foot: Secondary | ICD-10-CM | POA: Diagnosis not present

## 2017-07-31 DIAGNOSIS — Z79899 Other long term (current) drug therapy: Secondary | ICD-10-CM | POA: Diagnosis not present

## 2017-07-31 MED ORDER — AZITHROMYCIN 250 MG PO TABS: ORAL_TABLET | ORAL | 0 refills | Status: DC

## 2017-07-31 MED ORDER — AZELASTINE HCL 0.1 % NA SOLN: 2.0000 | Freq: Two times a day (BID) | NASAL | 12 refills | Status: DC

## 2017-07-31 NOTE — ED Provider Notes (Signed)
[facility] Emergency Department Provider Note  ____________________________________________   First MD Initiated Contact with Patient 07/31/17 1617     (approximate)  I have reviewed the triage vital signs and the nursing notes.   HISTORY  Chief Complaint Foot Pain (L foot)    HPI Jonathan Deleon is a 46 y.o. 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 Hypertension   . Seizures (HCC)   . Stroke [facility](HCC)     Patient Active Problem List   Diagnosis Date Noted  . Seizure (HCC) 03/18/2016    Past Surgical History:  Procedure Laterality Date  . BRAIN SURGERY    . COSMETIC SURGERY      Prior to Admission medications   Medication Sig Start Date End Date Taking? Authorizing Provider  albuterol (PROVENTIL HFA;VENTOLIN HFA) 108 (90 Base) MCG/ACT inhaler Inhale 2 puffs into the lungs every 4 (four) hours as needed for wheezing or shortness of breath. 06/29/17   Irean HongSung, Jade J, MD  aspirin 325 MG tablet Take 325 mg by mouth daily.    [provider]  azelastine (ASTELIN) 0.1 % nasal spray Place 2 sprays into both nostrils 2 (two) times daily. Use in each nostril as directed 07/31/17   Sherrie MustacheFisher, Roselyn BeringSusan W, PA-C  azithromycin (ZITHROMAX Z-PAK) 250 MG tablet 2 pills today then 1 pill a day for 4 days 07/31/17   Sherrie MustacheFisher, Roselyn BeringSusan W, PA-C  chlorpheniramine-HYDROcodone [facility](TUSSIONEX PENNKINETIC ER) 10-8 MG/5ML SUER Take 5 mLs by mouth 2 (two) times  daily. 06/29/17   Irean HongSung, Jade J, MD  efavirenz-emtricitabine-tenofovir (ATRIPLA) 600-200-300 MG per tablet Take 1 tablet by mouth at bedtime.    [provider]  estradiol (ESTRACE) 1 MG tablet Take 4 mg by mouth daily.    [provider]  levETIRAcetam (KEPPRA) 500 MG tablet Take 1 tablet (500 mg total) by mouth 2 (two) times daily. 03/19/16   Houston SirenSainani, Vivek J, MD  levETIRAcetam (KEPPRA) 500 MG tablet Take 1 tablet (500 mg total) by mouth 2 (two) times daily. 05/04/17   Menshew, Charlesetta IvoryJenise V Bacon, PA-C  magic mouthwash SOLN Take 5 mLs by mouth 3 (three) times daily as needed for mouth pain. 06/29/17   Irean HongSung, Jade J, MD  methylPREDNISolone (MEDROL DOSEPAK) 4 MG TBPK tablet Take as directed 06/29/17   Irean HongSung, Jade J, MD  spironolactone (ALDACTONE) 100 MG tablet Take 100 mg by mouth every morning. 09/14/14   [provider]    Allergies Penicillins  History reviewed. No pertinent family history.  Social History Social History   Tobacco Use  . Smoking status: Never Smoker  . Smokeless tobacco: Former Engineer, waterUser  Substance Use Topics  . Alcohol use: Yes    Comment: occas.   . Drug use: No    Review of Systems  Constitutional: No fever/chills Eyes: No visual changes. ENT: No sore throat.  Positive for sinus pain and congestion Respiratory: Denies cough Genitourinary:  Negative for dysuria. Musculoskeletal: Negative for back pain.  Positive left foot pain Skin: Negative for rash.    ____________________________________________   PHYSICAL EXAM:  VITAL SIGNS: ED Triage Vitals [07/31/17 1611]  Enc Vitals Group     BP 129/80     Pulse Rate 81     Resp 16     Temp 98.3 F (36.8 C)     Temp Source Oral     SpO2 99 %     Weight 160 lb (72.6 kg)     Height      Head Circumference      Peak Flow      Pain Score 8     Pain Loc      Pain Edu?      Excl. in GC?     Constitutional: Alert and oriented. Well appearing and in no acute distress. Eyes: Conjunctivae  are normal.  Head: Atraumatic. Nose: No congestion/rhinnorhea.  Positive for red swollen nasal mucosa Mouth/Throat: Mucous membranes are moist.  Positive for postnasal drip Cardiovascular: Normal rate, regular rhythm.  Heart sounds are normal Respiratory: Normal respiratory effort.  No retractions, lungs are clear to auscultation GU: deferred Musculoskeletal: FROM all extremities, warm and well perfused, left foot is tender along with fifth metatarsal, she has full range of motion is able to bear weight Neurologic:  Normal speech and language.  Skin:  Skin is warm, dry and intact. No rash noted. Psychiatric: Mood and affect are normal. Speech and behavior are normal.  ____________________________________________   LABS (all labs ordered are listed, but only abnormal results are displayed)  Labs Reviewed - No data to display ____________________________________________   ____________________________________________  RADIOLOGY  X-ray of the left foot is negative for fracture  ____________________________________________   PROCEDURES  Procedure(s) performed: No      ____________________________________________   INITIAL IMPRESSION / ASSESSMENT AND PLAN / ED COURSE  Pertinent labs & imaging results that were available during my care of the patient were reviewed by me and considered in my medical decision making (see chart for details).  Patient is a 46 year old male complaining of left foot pain and sinus infection, on physical exam left foot is tender, the sinuses are inflamed, x-ray of the left foot is negative, diagnosis is acute left foot pain and sinusitis, a prescription for Z-Pak and Astelin nose spray were provided, the patient is to use saline nasal rinse, she is to take ibuprofen for pain and inflammation, she states that her "boo"has a foot brace for plantar fasciitis that she can borrow, she will apply ice to the foot, and perform stretches, more patient states  she understands all instructions and will comply with our recommendations, she was discharged in stable condition      ____________________________________________   FINAL CLINICAL IMPRESSION(S) / ED DIAGNOSES  Final diagnoses:  Acute pain of left foot  Acute maxillary sinusitis, recurrence not specified      NEW MEDICATIONS STARTED DURING THIS VISIT:  This SmartLink is deprecated. Use AVSMEDLIST instead to display the medication list for a patient.   Note:  This document was prepared using Dragon voice recognition software and may include unintentional dictation errors.    Faythe GheeFisher, Jazsmine Macari W, PA-C 07/31/17 1734    Minna AntisPaduchowski, Kevin, MD 07/31/17 2300

## 2017-07-31 NOTE — ED Triage Notes (Signed)
Patient c/o L foot pain. No known injury

## 2017-07-31 NOTE — Discharge Instructions (Signed)
Follow up with your regular doctor if not better in 5-7 days, use the foot brace you have at home, take otc ibuprofen as needed

## 2017-09-22 ENCOUNTER — Encounter: Payer: Self-pay | Admitting: Emergency Medicine

## 2017-09-22 ENCOUNTER — Emergency Department
Admission: EM | Admit: 2017-09-22 | Discharge: 2017-09-22 | Disposition: A | Discharge status: Home / Self Care | Payer: Medicaid Other | Attending: Emergency Medicine | Admitting: Emergency Medicine

## 2017-09-22 ENCOUNTER — Other Ambulatory Visit: Payer: Self-pay

## 2017-09-22 DIAGNOSIS — Z79899 Other long term (current) drug therapy: Secondary | ICD-10-CM | POA: Insufficient documentation

## 2017-09-22 DIAGNOSIS — I10 Essential (primary) hypertension: Secondary | ICD-10-CM | POA: Diagnosis not present

## 2017-09-22 DIAGNOSIS — Z8673 Personal history of transient ischemic attack (TIA), and cerebral infarction without residual deficits: Secondary | ICD-10-CM | POA: Diagnosis not present

## 2017-09-22 DIAGNOSIS — Z7982 Long term (current) use of aspirin: Secondary | ICD-10-CM | POA: Insufficient documentation

## 2017-09-22 DIAGNOSIS — Z87891 Personal history of nicotine dependence: Secondary | ICD-10-CM | POA: Insufficient documentation

## 2017-09-22 DIAGNOSIS — J018 Other acute sinusitis: Secondary | ICD-10-CM | POA: Diagnosis not present

## 2017-09-22 DIAGNOSIS — J3489 Other specified disorders of nose and nasal sinuses: Secondary | ICD-10-CM | POA: Diagnosis present

## 2017-09-22 MED ORDER — FLUTICASONE PROPIONATE 50 MCG/ACT NA SUSP: 1.0000 | Freq: Every day | NASAL | 2 refills | Status: DC

## 2017-09-22 NOTE — ED Triage Notes (Signed)
Asked pt if ok to check in at first nurse to go ahead and get checked in, and pt verbalized permission. Here for sinus infection pt thinks.  C/o congestion and face pain. Reports has some mildew in house and thinks triggered the sinuses. Eating cheetos while checking in, NAD.

## 2017-09-22 NOTE — ED Provider Notes (Signed)
[facility] Emergency Department Provider Note  ____________________________________________   First MD Initiated Contact with Patient 09/22/17 1758     (approximate)  I have reviewed the triage vital signs and the nursing notes.   HISTORY  Chief Complaint Facial Pain    HPI Jonathan Deleon is a 47 y.o. adult who complains of sinus irritation.  He states he just found out there is mold in his house.  His nasal cavities are irritated.  He denies any fever or chills.  So the house which is treated today.  He denies cough or congestion at this time.  Past Medical History:  Diagnosis Date  . Hypertension   . Seizures (HCC)   . Stroke [facility])     Patient Active Problem List   Diagnosis Date Noted  . Seizure (HCC) 03/18/2016    Past Surgical History:  Procedure Laterality Date  . BRAIN SURGERY    . COSMETIC SURGERY      Prior to Admission medications   Medication Sig Start Date End Date Taking? Authorizing Provider  albuterol (PROVENTIL HFA;VENTOLIN HFA) 108 (90 Base) MCG/ACT inhaler Inhale 2 puffs into the lungs every 4 (four) hours as needed for wheezing or shortness of breath. 06/29/17   Irean Hong, MD  aspirin 325 MG tablet Take 325 mg by mouth daily.    [provider]  azelastine (ASTELIN) 0.1 % nasal spray Place 2 sprays into both nostrils 2 (two) times daily. Use in each nostril as directed 07/31/17   Sherrie Mustache Roselyn Bering, PA-C  efavirenz-emtricitabine-tenofovir (ATRIPLA) 600-200-300 MG per tablet Take 1 tablet by mouth at bedtime.    [provider]  estradiol (ESTRACE) 1 MG tablet Take 4 mg by mouth daily.    [provider]  fluticasone (FLONASE) 50 MCG/ACT nasal spray Place 1 spray into both nostrils daily. 09/22/17 09/22/18  Sherrie Mustache Roselyn Bering, PA-C  magic mouthwash SOLN Take 5 mLs by mouth 3 (three) times daily as needed for mouth pain. 06/29/17   Irean Hong, MD  spironolactone (ALDACTONE) 100 MG tablet Take 100 mg  by mouth every morning. 09/14/14   [provider]    Allergies Penicillins  History reviewed. No pertinent family history.  Social History Social History   Tobacco Use  . Smoking status: Never Smoker  . Smokeless tobacco: Former Engineer, water Use Topics  . Alcohol use: Yes    Comment: occas.   . Drug use: No    Review of Systems  Constitutional: No fever/chills Eyes: No visual changes. ENT: No sore throat.  Positive for sinus irritation Respiratory: Denies cough Genitourinary: Negative for dysuria. Musculoskeletal: Negative for back pain. Skin: Negative for rash.    ____________________________________________   PHYSICAL EXAM:  VITAL SIGNS: ED Triage Vitals  Enc Vitals Group     BP 09/22/17 1746 (!) 153/97     Pulse Rate 09/22/17 1746 83     Resp 09/22/17 1746 18     Temp 09/22/17 1746 98.6 F (37 C)     Temp Source 09/22/17 1746 Oral     SpO2 09/22/17 1746 100 %     Weight 09/22/17 1744 160 lb (72.6 kg)     Height 09/22/17 1744 5\' 9"  (1.753 m)     Head Circumference --      Peak Flow --      Pain Score 09/22/17 1744 10     Pain Loc --      Pain Edu? --  Excl. in GC? --     Constitutional: Alert and oriented. Well appearing and in no acute distress. Eyes: Conjunctivae are normal.  Head: Atraumatic. Nose: No congestion/rhinnorhea.  Nasal mucosa slightly swollen and pink.  There is noted drainage or mucus noted in the nasal cavity Mouth/Throat: Mucous membranes are moist.  Throat is normal Cardiovascular: Normal rate, regular rhythm.  Heart sounds are normal Respiratory: Normal respiratory effort.  No retractions, lungs clear to auscultation GU: deferred Musculoskeletal: FROM all extremities, warm and well perfused Neurologic:  Normal speech and language.  Skin:  Skin is warm, dry and intact. No rash noted. Psychiatric: Mood and affect are normal. Speech and behavior are normal.  ____________________________________________    LABS (all labs ordered are listed, but only abnormal results are displayed)  Labs Reviewed - No data to display ____________________________________________   ____________________________________________  RADIOLOGY    ____________________________________________   PROCEDURES  Procedure(s) performed: No  Procedures    ____________________________________________   INITIAL IMPRESSION / ASSESSMENT AND PLAN / ED COURSE  Pertinent labs & imaging results that were available during my care of the patient were reviewed by me and considered in my medical decision making (see chart for details).  Patient is 47 year old male that is transitioning to male patient.  He is complaining of sinus irritation due to recent finding of mold in his house.  On physical exam he appears well.  The sinus cavities are not tender.  Nasal mucosa is pink mildly swollen.  There appears to be no infection  Diagnosis is acute sinusitis  Patient was given a prescription for Flonase.  He is to do a nasal rinse.  If he is worsening he should return to the emergency department.  Patient states he understands and he was discharged in stable condition     As part of my medical decision making, I reviewed the following data within the electronic MEDICAL RECORD NUMBER Nursing notes reviewed and incorporated, Old chart reviewed, Notes from prior ED visits and [place] Controlled Substance Database  ____________________________________________   FINAL CLINICAL IMPRESSION(S) / ED DIAGNOSES  Final diagnoses:  Other acute sinusitis, recurrence not specified      NEW MEDICATIONS STARTED DURING THIS VISIT:  New Prescriptions   FLUTICASONE (FLONASE) 50 MCG/ACT NASAL SPRAY    Place 1 spray into both nostrils daily.     Note:  This document was prepared using Dragon voice recognition software and may include unintentional dictation errors.    Faythe GheeFisher, Susan W, PA-C 09/22/17 1823    Minna AntisPaduchowski, Kevin,  MD 09/22/17 [phone]

## 2017-09-22 NOTE — ED Notes (Addendum)
See triage note  States having some nasal congestion and feels like can't breath  Hx of sinus problems in past  No fever on arrival  States has been exposed to mold

## 2017-09-22 NOTE — Discharge Instructions (Signed)
Follow-up with your regular doctor or the acute care if not better in 3 days.  Return to emergency department if you are worsening.  Use Flonase as prescribed.  You may also use a Nettie pot

## 2017-10-10 ENCOUNTER — Encounter: Payer: Self-pay | Admitting: *Deleted

## 2017-10-10 ENCOUNTER — Emergency Department
Admission: EM | Admit: 2017-10-10 | Discharge: 2017-10-11 | Disposition: A | Discharge status: Home / Self Care | Payer: Medicaid Other | Attending: Emergency Medicine | Admitting: Emergency Medicine

## 2017-10-10 DIAGNOSIS — Z87891 Personal history of nicotine dependence: Secondary | ICD-10-CM | POA: Diagnosis not present

## 2017-10-10 DIAGNOSIS — I10 Essential (primary) hypertension: Secondary | ICD-10-CM | POA: Insufficient documentation

## 2017-10-10 DIAGNOSIS — Z79899 Other long term (current) drug therapy: Secondary | ICD-10-CM | POA: Diagnosis not present

## 2017-10-10 DIAGNOSIS — Z7982 Long term (current) use of aspirin: Secondary | ICD-10-CM | POA: Insufficient documentation

## 2017-10-10 DIAGNOSIS — R569 Unspecified convulsions: Secondary | ICD-10-CM

## 2017-10-10 DIAGNOSIS — W19XXXA Unspecified fall, initial encounter: Secondary | ICD-10-CM

## 2017-10-10 HISTORY — DX: Human immunodeficiency virus (HIV) disease: B20

## 2017-10-10 HISTORY — DX: Asymptomatic human immunodeficiency virus (hiv) infection status: Z21

## 2017-10-10 MED ORDER — LEVETIRACETAM IN NACL 1000 MG/100ML IV SOLN
1000.0000 mg | INTRAVENOUS | Status: DC
Start: 2017-10-11 — End: 2017-10-10

## 2017-10-10 MED ORDER — SODIUM CHLORIDE 0.9 % IV SOLN
1000.0000 mg | Freq: Once | INTRAVENOUS | Status: AC
  Administered 2017-10-11: 1000 mg via INTRAVENOUS
  Filled 2017-10-10: qty 10

## 2017-10-10 NOTE — ED Triage Notes (Signed)
Arrives via EMS, staring and having seizure like activity and fell to the floor, lasting 15 minutes per witness. Pt was post ictal on EMS arrival to scene. Pt takes Keppra, has not had dose in about 2 days.   Vitals en route, 120/72,hr 106, cbg 118, 100% RA. Unable to establish IV access

## 2017-10-11 LAB — COMPREHENSIVE METABOLIC PANEL
ALBUMIN: 3.8 g/dL (ref 3.5–5.0)
ALK PHOS: 99 U/L (ref 38–126)
ALT: 13 U/L — AB (ref 17–63)
ANION GAP: 10 (ref 5–15)
AST: 22 U/L (ref 15–41)
BILIRUBIN TOTAL: 0.6 mg/dL (ref 0.3–1.2)
BUN: 19 mg/dL (ref 6–20)
CALCIUM: 8.8 mg/dL — AB (ref 8.9–10.3)
CO2: 21 mmol/L — ABNORMAL LOW (ref 22–32)
CREATININE: 1.03 mg/dL (ref 0.61–1.24)
Chloride: 105 mmol/L (ref 101–111)
GFR calc Af Amer: >60 mL/min (ref >60)
GFR calc non Af Amer: >60 mL/min (ref >60)
GLUCOSE: 102 mg/dL — AB (ref 65–99)
Potassium: 3.8 mmol/L (ref 3.5–5.1)
Sodium: 136 mmol/L (ref 135–145)
TOTAL PROTEIN: 7.6 g/dL (ref 6.5–8.1)

## 2017-10-11 LAB — URINALYSIS, ROUTINE W REFLEX MICROSCOPIC
BILIRUBIN URINE: NEGATIVE
GLUCOSE, UA: NEGATIVE mg/dL
HGB URINE DIPSTICK: NEGATIVE
Ketones, ur: NEGATIVE mg/dL
Leukocytes, UA: NEGATIVE
Nitrite: NEGATIVE
PH: 5 (ref 5.0–8.0)
Protein, ur: NEGATIVE mg/dL
SPECIFIC GRAVITY, URINE: 1.021 (ref 1.005–1.030)

## 2017-10-11 LAB — CBC WITH DIFFERENTIAL/PLATELET
BASOS PCT: 1 %
Basophils Absolute: 0 10*3/uL (ref 0–0.1)
Eosinophils Absolute: 0.1 10*3/uL (ref 0–0.7)
Eosinophils Relative: 1 %
HEMATOCRIT: 41.5 % (ref 40.0–52.0)
HEMOGLOBIN: 13.4 g/dL (ref 13.0–18.0)
Lymphocytes Relative: 16 %
Lymphs Abs: 0.8 10*3/uL — ABNORMAL LOW (ref 1.0–3.6)
MCH: 27.7 pg (ref 26.0–34.0)
MCHC: 32.3 g/dL (ref 32.0–36.0)
MCV: 85.7 fL (ref 80.0–100.0)
MONOS PCT: 10 %
Monocytes Absolute: 0.5 10*3/uL (ref 0.2–1.0)
NEUTROS ABS: 3.6 10*3/uL (ref 1.4–6.5)
NEUTROS PCT: 72 %
Platelets: 251 10*3/uL (ref 150–440)
RBC: 4.84 MIL/uL (ref 4.40–5.90)
RDW: 13.7 % (ref 11.5–14.5)
WBC: 5.1 10*3/uL (ref 3.8–10.6)

## 2017-10-11 LAB — MAGNESIUM: Magnesium: 2.4 mg/dL (ref 1.7–2.4)

## 2017-10-11 LAB — LACTIC ACID, PLASMA: Lactic Acid, Venous: 1.7 mmol/L (ref 0.5–1.9)

## 2017-10-11 NOTE — ED Provider Notes (Signed)
[facility] Emergency Department Provider Note  ____________________________________________   First MD Initiated Contact with Patient 10/11/17 0010     (approximate)  I have reviewed the triage vital signs and the nursing notes.   HISTORY  Chief Complaint Seizures    HPI Jonathan Deleon is a 47 y.o. adult who identifies as transgender male to male and with medical history as listed below who presents by EMS for evaluation after a seizure-like episode.  She reports that she has been in her usual state of health recently, but she was busy today and was on her way out the door and did not want to take Keppra before driving because sometimes it makes her feel funny.  She had a seizure typical for her and reportedly it may have lasted as long as 15 minutes although that was based on I would not report may not be accurate.  She denies recent fever/chills, chest pain, shortness of breath, nausea, vomiting, and abdominal pain.  She does not feel that she sustained any injuries from her seizure although she says her head hurts a little bit after falling.  She is moving all 4 extremities and remembers everything that happened.  She says that she is ready to go home and go to bed.  Nothing particular made her symptoms better or worse.  Past Medical History:  Diagnosis Date  . HIV (human immunodeficiency virus infection) (HCC)   . Hypertension   . Seizures (HCC)   . Stroke [facility])     Patient Active Problem List   Diagnosis Date Noted  . Seizure (HCC) 03/18/2016    Past Surgical History:  Procedure Laterality Date  . BRAIN SURGERY    . COSMETIC SURGERY      Prior to Admission medications   Medication Sig Start Date End Date Taking? Authorizing Provider  albuterol (PROVENTIL HFA;VENTOLIN HFA) 108 (90 Base) MCG/ACT inhaler Inhale 2 puffs into the lungs every 4 (four) hours as needed for wheezing or shortness of breath. 06/29/17   Irean Hong, MD  aspirin 325  MG tablet Take 325 mg by mouth daily.    [provider]  azelastine (ASTELIN) 0.1 % nasal spray Place 2 sprays into both nostrils 2 (two) times daily. Use in each nostril as directed 07/31/17   Sherrie Mustache Roselyn Bering, PA-C  efavirenz-emtricitabine-tenofovir (ATRIPLA) 600-200-300 MG per tablet Take 1 tablet by mouth at bedtime.    [provider]  estradiol (ESTRACE) 1 MG tablet Take 4 mg by mouth daily.    [provider]  fluticasone (FLONASE) 50 MCG/ACT nasal spray Place 1 spray into both nostrils daily. 09/22/17 09/22/18  Sherrie Mustache Roselyn Bering, PA-C  magic mouthwash SOLN Take 5 mLs by mouth 3 (three) times daily as needed for mouth pain. 06/29/17   Irean Hong, MD  spironolactone (ALDACTONE) 100 MG tablet Take 100 mg by mouth every morning. 09/14/14   [provider]    Allergies Penicillins  No family history on file.  Social History Social History   Tobacco Use  . Smoking status: Never Smoker  . Smokeless tobacco: Former Engineer, water Use Topics  . Alcohol use: Yes    Comment: occas.   . Drug use: No    Review of Systems Constitutional: No fever/chills Eyes: No visual changes. ENT: No sore throat. Cardiovascular: Denies chest pain. Respiratory: Denies shortness of breath. Gastrointestinal: No abdominal pain.  No nausea, no vomiting.  No diarrhea.  No constipation. Genitourinary: Negative for  dysuria. Musculoskeletal: Negative for neck pain.  Negative for back pain. Integumentary: Negative for rash. Neurological: Reported seizure-like activity.  Mild generalized headache, no focal weakness or numbness.   ____________________________________________   PHYSICAL EXAM:  VITAL SIGNS: ED Triage Vitals  Enc Vitals Group     BP 10/10/17 2321 118/87     Pulse Rate 10/10/17 2321 (!) 101     Resp 10/10/17 2321 16     Temp 10/10/17 2321 98.3 F (36.8 C)     Temp Source 10/10/17 2321 Oral     SpO2 10/10/17 2321 99 %     Weight 10/10/17 2320 72.6 kg  (160 lb)     Height 10/10/17 2320 1.753 m (5\' 9" )     Head Circumference --      Peak Flow --      Pain Score 10/10/17 2321 0     Pain Loc --      Pain Edu? --      Excl. in GC? --     Constitutional: Alert and oriented. Well appearing and in no acute distress. 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 Head: Atraumatic, no evidence of trauma Nose: No congestion/rhinnorhea. Mouth/Throat: Mucous membranes are moist. Neck: No stridor.  No meningeal signs.   Cardiovascular: Normal rate, regular rhythm. Good peripheral circulation. Grossly normal heart sounds. Respiratory: Normal respiratory effort.  No retractions. Lungs CTAB. Gastrointestinal: Soft and nontender. No distention.  Musculoskeletal: No lower extremity tenderness nor edema. No gross deformities of extremities. Neurologic:  Normal speech and language. No gross focal neurologic deficits are appreciated.  Skin:  Skin is warm, dry and intact. No rash noted. Psychiatric: Mood and affect are normal. Speech and behavior are normal.  ____________________________________________   LABS (all labs ordered are listed, but only abnormal results are displayed)  Labs Reviewed  CBC WITH DIFFERENTIAL/PLATELET - Abnormal; Notable for the following components:      Result Value   Lymphs Abs 0.8 (*)    All other components within normal limits  COMPREHENSIVE METABOLIC PANEL - Abnormal; Notable for the following components:   CO2 21 (*)    Glucose, Bld 102 (*)    Calcium 8.8 (*)    ALT 13 (*)    All other components within normal limits  URINALYSIS, ROUTINE W REFLEX MICROSCOPIC - Abnormal; Notable for the following components:   Color, Urine YELLOW (*)    APPearance CLEAR (*)    All other components within normal limits  LACTIC ACID, PLASMA  MAGNESIUM   ____________________________________________  EKG  None - EKG not ordered by ED  physician ____________________________________________  RADIOLOGY   ED MD interpretation: No indication for imaging  Official radiology report(s): No results found.  ____________________________________________   PROCEDURES  Critical Care performed: No   Procedure(s) performed:   Procedures   ____________________________________________   INITIAL IMPRESSION / ASSESSMENT AND PLAN / ED COURSE  As part of my medical decision making, I reviewed the following data within the electronic MEDICAL RECORD NUMBER Nursing notes reviewed and incorporated, Labs reviewed , Old chart reviewed and Notes from prior ED visits    Differential diagnosis includes, but is not limited to, medication noncompliance, acute intracranial bleeding or tumor, infectious process lowering seizure threshold, metabolic abnormality, alcohol or drug use, sleep deprivation leading to lower seizure threshold.  The patient is well-appearing in no acute distress and admits to not taking seizure medicine today.  As per standard of care, we will check  lab work to look for any metabolic derangements or other abnormalities that are concerning in the setting of history as described above, but there is no indication for any imaging at this time.  The patient is well-appearing, in no acute distress, and has no complaints other than a mild headache after the episode.  She states she is ready to go home and I think that is appropriate after getting back her results.  I have given Keppra 1000 mg IV and she has doctors in [place] with whom she can follow-up.  Clinical Course as of Oct 11 45  Sat Oct 11, 2017  0037 Lactic acid is within normal limits which is reassuring both from an infectious standpoint and also because I would anticipate that if she had had a seizure, especially one lasting 15 minutes, the lactic acid should be elevated.  Patient remains in no acute distress.  I am awaiting the results of the urinalysis and then  the patient should be able to be discharged for outpatient follow-up. Lactic Acid, Venous: 1.7 [CF]  0046 Normal urinalysis.  I will prepare paperwork for the plan as described above.  [CF]    Clinical Course User Index [CF] Loleta RoseForbach, Annalisa Colonna, MD    ____________________________________________  FINAL CLINICAL IMPRESSION(S) / ED DIAGNOSES  Final diagnoses:  Seizure (HCC)  Fall, initial encounter     MEDICATIONS GIVEN DURING THIS VISIT:  Medications  levETIRAcetam (KEPPRA) 1,000 mg in sodium chloride 0.9 % 100 mL IVPB (0 mg Intravenous Stopped 10/11/17 0043)     ED Discharge Orders    None       Note:  This document was prepared using Dragon voice recognition software and may include unintentional dictation errors.    Loleta RoseForbach, Conita Amenta, MD 10/11/17 [phone]

## 2017-10-11 NOTE — ED Notes (Signed)
ED Provider at bedside. 

## 2017-10-11 NOTE — Discharge Instructions (Signed)

## 2017-10-23 ENCOUNTER — Encounter: Payer: Self-pay | Admitting: Emergency Medicine

## 2017-10-23 ENCOUNTER — Emergency Department
Admission: EM | Admit: 2017-10-23 | Discharge: 2017-10-23 | Disposition: A | Discharge status: Home / Self Care | Payer: Medicaid Other | Attending: Emergency Medicine | Admitting: Emergency Medicine

## 2017-10-23 ENCOUNTER — Emergency Department: Payer: Medicaid Other

## 2017-10-23 DIAGNOSIS — J329 Chronic sinusitis, unspecified: Secondary | ICD-10-CM | POA: Diagnosis not present

## 2017-10-23 DIAGNOSIS — I10 Essential (primary) hypertension: Secondary | ICD-10-CM | POA: Diagnosis not present

## 2017-10-23 DIAGNOSIS — R079 Chest pain, unspecified: Secondary | ICD-10-CM

## 2017-10-23 DIAGNOSIS — B20 Human immunodeficiency virus [HIV] disease: Secondary | ICD-10-CM | POA: Insufficient documentation

## 2017-10-23 DIAGNOSIS — R0789 Other chest pain: Secondary | ICD-10-CM | POA: Insufficient documentation

## 2017-10-23 DIAGNOSIS — J328 Other chronic sinusitis: Secondary | ICD-10-CM

## 2017-10-23 LAB — BASIC METABOLIC PANEL
ANION GAP: 8 (ref 5–15)
BUN: 14 mg/dL (ref 6–20)
CHLORIDE: 107 mmol/L (ref 101–111)
CO2: 23 mmol/L (ref 22–32)
Calcium: 8.6 mg/dL — ABNORMAL LOW (ref 8.9–10.3)
Creatinine, Ser: 0.96 mg/dL (ref 0.61–1.24)
GFR calc non Af Amer: >60 mL/min (ref >60)
Glucose, Bld: 93 mg/dL (ref 65–99)
Potassium: 3.9 mmol/L (ref 3.5–5.1)
Sodium: 138 mmol/L (ref 135–145)

## 2017-10-23 LAB — CBC
HCT: 41.8 % (ref 40.0–52.0)
HEMOGLOBIN: 13.7 g/dL (ref 13.0–18.0)
MCH: 28.3 pg (ref 26.0–34.0)
MCHC: 32.7 g/dL (ref 32.0–36.0)
MCV: 86.5 fL (ref 80.0–100.0)
Platelets: 244 10*3/uL (ref 150–440)
RBC: 4.84 MIL/uL (ref 4.40–5.90)
RDW: 14.4 % (ref 11.5–14.5)
WBC: 4.5 10*3/uL (ref 3.8–10.6)

## 2017-10-23 LAB — TROPONIN I: Troponin I: <0.03 ng/mL (ref <0.03)

## 2017-10-23 MED ORDER — AZITHROMYCIN 250 MG PO TABS: ORAL_TABLET | ORAL | 0 refills | Status: DC

## 2017-10-23 NOTE — ED Triage Notes (Signed)
Pt comes into the ED via POV c/o chest pain that has been intermittent since the seizures she had last month.  Patient in NAD at this time and states she has also had cold like symptoms and she is unsure if the chest spasms are related to the seizures or her sinus problems.  Patient has even and unlabored respirations at this time and in NAD.

## 2017-10-23 NOTE — ED Notes (Signed)
PT in NAd at time of discharge and is ambulatory at this time.
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
## 2018-01-30 ENCOUNTER — Emergency Department
Admission: EM | Admit: 2018-01-30 | Discharge: 2018-01-30 | Disposition: A | Discharge status: Home / Self Care | Payer: Medicaid Other | Attending: Student in an Organized Health Care Education/Training Program | Admitting: Student in an Organized Health Care Education/Training Program

## 2018-01-30 DIAGNOSIS — B20 Human immunodeficiency virus [HIV] disease: Secondary | ICD-10-CM | POA: Diagnosis not present

## 2018-01-30 DIAGNOSIS — Z7982 Long term (current) use of aspirin: Secondary | ICD-10-CM | POA: Insufficient documentation

## 2018-01-30 DIAGNOSIS — R569 Unspecified convulsions: Secondary | ICD-10-CM | POA: Diagnosis not present

## 2018-01-30 DIAGNOSIS — Z79899 Other long term (current) drug therapy: Secondary | ICD-10-CM | POA: Insufficient documentation

## 2018-01-30 LAB — BASIC METABOLIC PANEL
Anion gap: 12 (ref 5–15)
BUN: 20 mg/dL (ref 6–20)
CALCIUM: 8.8 mg/dL — AB (ref 8.9–10.3)
CHLORIDE: 108 mmol/L (ref 98–111)
CO2: 16 mmol/L — ABNORMAL LOW (ref 22–32)
CREATININE: 1.12 mg/dL (ref 0.61–1.24)
Glucose, Bld: 131 mg/dL — ABNORMAL HIGH (ref 70–99)
Potassium: 4.3 mmol/L (ref 3.5–5.1)
SODIUM: 136 mmol/L (ref 135–145)

## 2018-01-30 MED ORDER — DIPHENHYDRAMINE HCL 50 MG/ML IJ SOLN
12.5000 mg | Freq: Once | INTRAMUSCULAR | Status: AC
  Administered 2018-01-30: 12.5 mg via INTRAVENOUS
  Filled 2018-01-30: qty 1

## 2018-01-30 MED ORDER — ACETAMINOPHEN 500 MG PO TABS
1000.0000 mg | ORAL_TABLET | Freq: Once | ORAL | Status: AC
  Administered 2018-01-30: 1000 mg via ORAL
  Filled 2018-01-30: qty 2

## 2018-01-30 MED ORDER — METOCLOPRAMIDE HCL 5 MG/ML IJ SOLN
10.0000 mg | Freq: Once | INTRAMUSCULAR | Status: AC
  Administered 2018-01-30: 10 mg via INTRAVENOUS
  Filled 2018-01-30: qty 2

## 2018-01-30 MED ORDER — LEVETIRACETAM IN NACL 1000 MG/100ML IV SOLN
1000.0000 mg | Freq: Once | INTRAVENOUS | Status: AC
  Administered 2018-01-30: 1000 mg via INTRAVENOUS
  Filled 2018-01-30: qty 100

## 2018-01-30 MED ORDER — LEVETIRACETAM 500 MG PO TABS: 500.0000 mg | ORAL_TABLET | Freq: Two times a day (BID) | ORAL | 0 refills | Status: AC

## 2018-01-30 NOTE — ED Triage Notes (Signed)
Pt presents today via [facility] from home. EMS reports that pt was postradical upon arrival. Pt was cutting hair when pt just starred out in the no where and then started shaking .PT was then moved to the floor by ppl around.
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
## 2018-04-26 ENCOUNTER — Emergency Department: Payer: Medicaid Other

## 2018-04-26 ENCOUNTER — Emergency Department
Admission: EM | Admit: 2018-04-26 | Discharge: 2018-04-26 | Disposition: A | Discharge status: Home / Self Care | Payer: Medicaid Other | Attending: Emergency Medicine | Admitting: Emergency Medicine

## 2018-04-26 ENCOUNTER — Encounter: Payer: Self-pay | Admitting: Emergency Medicine

## 2018-04-26 DIAGNOSIS — I10 Essential (primary) hypertension: Secondary | ICD-10-CM | POA: Diagnosis not present

## 2018-04-26 DIAGNOSIS — Z79899 Other long term (current) drug therapy: Secondary | ICD-10-CM | POA: Insufficient documentation

## 2018-04-26 DIAGNOSIS — J069 Acute upper respiratory infection, unspecified: Secondary | ICD-10-CM | POA: Insufficient documentation

## 2018-04-26 DIAGNOSIS — Z7982 Long term (current) use of aspirin: Secondary | ICD-10-CM | POA: Diagnosis not present

## 2018-04-26 DIAGNOSIS — Z113 Encounter for screening for infections with a predominantly sexual mode of transmission: Secondary | ICD-10-CM | POA: Insufficient documentation

## 2018-04-26 DIAGNOSIS — R21 Rash and other nonspecific skin eruption: Secondary | ICD-10-CM | POA: Diagnosis not present

## 2018-04-26 DIAGNOSIS — R0981 Nasal congestion: Secondary | ICD-10-CM | POA: Diagnosis present

## 2018-04-26 LAB — URINALYSIS, ROUTINE W REFLEX MICROSCOPIC
Bilirubin Urine: NEGATIVE
Glucose, UA: NEGATIVE mg/dL
Hgb urine dipstick: NEGATIVE
Ketones, ur: NEGATIVE mg/dL
LEUKOCYTES UA: NEGATIVE
NITRITE: NEGATIVE
PH: 7 (ref 5.0–8.0)
Protein, ur: NEGATIVE mg/dL
SPECIFIC GRAVITY, URINE: 1.02 (ref 1.005–1.030)

## 2018-04-26 LAB — CHLAMYDIA/NGC RT PCR (ARMC ONLY)
CHLAMYDIA TR: NOT DETECTED
N GONORRHOEAE: NOT DETECTED

## 2018-04-26 MED ORDER — SULFAMETHOXAZOLE-TRIMETHOPRIM 800-160 MG PO TABS
1.0000 | ORAL_TABLET | Freq: Once | ORAL | Status: AC
  Administered 2018-04-26: 1 via ORAL
  Filled 2018-04-26: qty 1

## 2018-04-26 MED ORDER — TRIAMCINOLONE ACETONIDE 0.025 % EX OINT: 1.0000 "application " | TOPICAL_OINTMENT | Freq: Two times a day (BID) | CUTANEOUS | 0 refills | Status: DC

## 2018-04-26 MED ORDER — SULFAMETHOXAZOLE-TRIMETHOPRIM 800-160 MG PO TABS: 1.0000 | ORAL_TABLET | Freq: Two times a day (BID) | ORAL | 0 refills | Status: DC

## 2018-04-26 NOTE — ED Notes (Addendum)
Pt  Reports   Cough   And  Sinus   Congestion    Redness in throat  With  sorethroat   Symptoms x 1  Month   Also reports  Low abd  Pain  Dark urine   Burns when she  Urinates . Wants  Rectum checked a swell  Pt also has tender raised area  on her back  X  1 month
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
## 2018-04-26 NOTE — ED Triage Notes (Signed)
Patient states that she has had congestion for a couple of weeks. Patient states that she was seen for it and diagnosed with viral infection. Patient states that she continues to have congestion and feels like it is moving into her chest.  Patient states that she has a bit to her back that she has had about a week and not improving.

## 2018-05-10 ENCOUNTER — Emergency Department
Admission: EM | Admit: 2018-05-10 | Discharge: 2018-05-10 | Disposition: A | Discharge status: Home / Self Care | Payer: Medicaid Other | Attending: Emergency Medicine | Admitting: Emergency Medicine

## 2018-05-10 DIAGNOSIS — Z79899 Other long term (current) drug therapy: Secondary | ICD-10-CM | POA: Diagnosis not present

## 2018-05-10 DIAGNOSIS — Z7982 Long term (current) use of aspirin: Secondary | ICD-10-CM | POA: Insufficient documentation

## 2018-05-10 DIAGNOSIS — J0101 Acute recurrent maxillary sinusitis: Secondary | ICD-10-CM | POA: Diagnosis not present

## 2018-05-10 DIAGNOSIS — I10 Essential (primary) hypertension: Secondary | ICD-10-CM | POA: Diagnosis not present

## 2018-05-10 DIAGNOSIS — R21 Rash and other nonspecific skin eruption: Secondary | ICD-10-CM | POA: Diagnosis present

## 2018-05-10 DIAGNOSIS — Z87891 Personal history of nicotine dependence: Secondary | ICD-10-CM | POA: Insufficient documentation

## 2018-05-10 MED ORDER — LEVOFLOXACIN 500 MG PO TABS: 500.0000 mg | ORAL_TABLET | Freq: Every day | ORAL | 0 refills | Status: AC

## 2018-05-10 MED ORDER — HYDROCODONE-ACETAMINOPHEN 5-325 MG PO TABS: 1.0000 | ORAL_TABLET | Freq: Three times a day (TID) | ORAL | 0 refills | Status: DC | PRN

## 2018-05-10 NOTE — ED Notes (Signed)

## 2018-05-10 NOTE — Discharge Instructions (Signed)
Please take medications as prescribed.  Continue with Nettie pot or nasal saline flushes to help alleviate sinus pressure.  Return to the emergency department for any fevers.  Follow-up with primary care provider if no relief in 1 week.

## 2018-05-10 NOTE — ED Triage Notes (Signed)
Pt presents via POV c/o rash over body and swelling in right side of face. Reports taking Bactrim for sinus infection per pt. Reports sinus pressure.

## 2018-05-10 NOTE — ED Provider Notes (Signed)
[facility] EMERGENCY [facility]PARTMENT Provider Note   CSN: [id number] Arrival date & time: 05/10/18  1507     History   Chief Complaint Chief Complaint  Patient presents with  . Rash    HPI Jonathan Deleon is a 47 y.o. adult presents to the emergency department for evaluation of maxillary sinus pain and pressure.  She was seen on 04/26/2018 and diagnosed with sinus infection and placed on Bactrim.  She saw some improvement in her symptoms but recently has had increased pain and swelling along the maxillary sinuses.  She denies any fevers.  She said a lot of posterior pharyngeal drainage with no productive cough.  She denies any chest pain or shortness of breath.  She describes moderate pain and pressure to touch along the maxillary sinus.  Pain is moderate.  She is unable to tolerate NSAIDs.  HPI  Past Medical History:  Diagnosis Date  . HIV (human immunodeficiency virus infection) (HCC)   . Hypertension   . Seizures (HCC)   . Stroke [facility])     Patient Active Problem List   Diagnosis Date Noted  . Seizure (HCC) 03/18/2016    Past Surgical History:  Procedure Laterality Date  . BRAIN SURGERY    . COSMETIC SURGERY          Home Medications    Prior to Admission medications   Medication Sig Start Date End Date Taking? Authorizing Provider  albuterol (PROVENTIL HFA;VENTOLIN HFA) 108 (90 Base) MCG/ACT inhaler Inhale 2 puffs into the lungs every 4 (four) hours as needed for wheezing or shortness of breath. 06/29/17   Irean Hong, MD  aspirin 325 MG tablet Take 325 mg by mouth daily.    [provider]  azelastine (ASTELIN) 0.1 % nasal spray Place 2 sprays into both nostrils 2 (two) times daily. Use in each nostril as directed 07/31/17   Sherrie Mustache Roselyn Bering, PA-C  azithromycin (ZITHROMAX Z-PAK) 250 MG tablet Take 2 tablets (500 mg) on  Day 1,  followed by 1 tablet (250 mg) once daily on Days 2 through 5. 10/23/17   Emily Filbert, MD    efavirenz-emtricitabine-tenofovir (ATRIPLA) 600-200-300 MG per tablet Take 1 tablet by mouth at bedtime.    [provider]  estradiol (ESTRACE) 1 MG tablet Take 4 mg by mouth daily.    [provider]  fluticasone (FLONASE) 50 MCG/ACT nasal spray Place 1 spray into both nostrils daily. 09/22/17 09/22/18  Sherrie Mustache, Roselyn Bering, PA-C  levETIRAcetam (KEPPRA) 500 MG tablet Take 1 tablet (500 mg total) by mouth 2 (two) times daily. 01/30/18 03/01/18  Willy Eddy, MD  levofloxacin (LEVAQUIN) 500 MG tablet Take 1 tablet (500 mg total) by mouth daily for 10 days. 05/10/18 05/20/18  Evon Slack, PA-C  magic mouthwash SOLN Take 5 mLs by mouth 3 (three) times daily as needed for mouth pain. 06/29/17   Irean Hong, MD  spironolactone (ALDACTONE) 100 MG tablet Take 100 mg by mouth every morning. 09/14/14   [provider]  sulfamethoxazole-trimethoprim (BACTRIM DS,SEPTRA DS) 800-160 MG tablet Take 1 tablet by mouth 2 (two) times daily. 04/26/18   Enid Derry, PA-C  triamcinolone (KENALOG) 0.025 % ointment Apply 1 application topically 2 (two) times daily. 04/26/18   Enid Derry, PA-C    Family History History reviewed. No pertinent family history.  Social History Social History   Tobacco Use  . Smoking status: Never Smoker  . Smokeless tobacco: Former Engineer, water Use Topics  .  Alcohol use: Yes    Comment: occas.   . Drug use: No     Allergies   Penicillins and Shellfish allergy   Review of Systems Review of Systems  Constitutional: Negative for fatigue and fever.  HENT: Positive for congestion, sinus pressure and sinus pain. Negative for sneezing, sore throat and trouble swallowing.   Eyes: Negative for discharge, redness and itching.  Respiratory: Negative for shortness of breath.   Cardiovascular: Negative for chest pain.  Musculoskeletal: Negative for myalgias.  Skin: Negative for rash and wound.  Neurological: Negative for dizziness, facial  asymmetry, light-headedness, numbness and headaches.     Physical Exam Updated Vital Signs BP 130/82   Pulse 81   Temp 98.4 F (36.9 C) (Oral)   Resp 14   Wt 72.6 kg   SpO2 98%   BMI 23.63 kg/m   Physical Exam  Constitutional: She is oriented to person, place, and time. She appears well-developed and well-nourished.  HENT:  Head: Normocephalic and atraumatic.  Right Ear: External ear normal.  Left Ear: External ear normal.  Nose: Nose normal.  Mouth/Throat: Oropharynx is clear and moist. No oropharyngeal exudate.  Tender along bilateral maxillary sinuses.  No swelling warmth or redness noted.  Bilateral TMs normal.  No rashes noted.  Eyes: Conjunctivae are normal. Right eye exhibits no discharge. Left eye exhibits no discharge.  Neck: Normal range of motion.  Cardiovascular: Normal rate.  Pulmonary/Chest: Effort normal. No stridor. No respiratory distress. She has no wheezes.  Musculoskeletal: Normal range of motion.  Lymphadenopathy:    She has no cervical adenopathy.  Neurological: She is alert and oriented to person, place, and time. No cranial nerve deficit.  Skin: Skin is warm. No rash noted.  Psychiatric: She has a normal mood and affect. Her behavior is normal. Judgment and thought content normal.     ED Treatments / Results  Labs (all labs ordered are listed, but only abnormal results are displayed) Labs Reviewed - No data to display  EKG None  Radiology No results found.  Procedures Procedures (including critical care time)  Medications Ordered in ED Medications - No data to display   Initial Impression / Assessment and Plan / ED Course  I have reviewed the triage vital signs and the nursing notes.  Pertinent labs & imaging results that were available during my care of the patient were reviewed by me and considered in my medical decision making (see chart for details).    47 year old male with recurrent maxillary sinusitis.  Saw some  improvement initially Bactrim that symptoms returning.  She is placed on Levaquin.  She understands signs and symptoms return to the ED for such as any fevers, increasing pain, worsening symptoms or urgent changes in health. Final Clinical Impressions(s) / ED Diagnoses   Final diagnoses:  Acute recurrent maxillary sinusitis    ED Discharge Orders         Ordered    levofloxacin (LEVAQUIN) 500 MG tablet  Daily     05/10/18 1639           Evon Slack, PA-C 05/10/18 1643    Arnaldo Natal, MD 05/11/18 [phone]

## 2018-06-28 ENCOUNTER — Emergency Department: Payer: Medicaid Other

## 2018-06-28 ENCOUNTER — Encounter: Payer: Self-pay | Admitting: Emergency Medicine

## 2018-06-28 ENCOUNTER — Emergency Department
Admission: EM | Admit: 2018-06-28 | Discharge: 2018-06-28 | Disposition: A | Discharge status: Home / Self Care | Payer: Medicaid Other | Attending: Emergency Medicine | Admitting: Emergency Medicine

## 2018-06-28 ENCOUNTER — Other Ambulatory Visit: Payer: Self-pay

## 2018-06-28 DIAGNOSIS — I10 Essential (primary) hypertension: Secondary | ICD-10-CM | POA: Diagnosis not present

## 2018-06-28 DIAGNOSIS — Z79899 Other long term (current) drug therapy: Secondary | ICD-10-CM | POA: Insufficient documentation

## 2018-06-28 DIAGNOSIS — Z8673 Personal history of transient ischemic attack (TIA), and cerebral infarction without residual deficits: Secondary | ICD-10-CM | POA: Diagnosis not present

## 2018-06-28 DIAGNOSIS — Z7982 Long term (current) use of aspirin: Secondary | ICD-10-CM | POA: Insufficient documentation

## 2018-06-28 DIAGNOSIS — J302 Other seasonal allergic rhinitis: Secondary | ICD-10-CM | POA: Insufficient documentation

## 2018-06-28 DIAGNOSIS — R109 Unspecified abdominal pain: Secondary | ICD-10-CM

## 2018-06-28 DIAGNOSIS — Z87891 Personal history of nicotine dependence: Secondary | ICD-10-CM | POA: Insufficient documentation

## 2018-06-28 DIAGNOSIS — B20 Human immunodeficiency virus [HIV] disease: Secondary | ICD-10-CM | POA: Insufficient documentation

## 2018-06-28 LAB — URINALYSIS, COMPLETE (UACMP) WITH MICROSCOPIC
Bilirubin Urine: NEGATIVE
GLUCOSE, UA: NEGATIVE mg/dL
HGB URINE DIPSTICK: NEGATIVE
KETONES UR: NEGATIVE mg/dL
Leukocytes, UA: NEGATIVE
NITRITE: NEGATIVE
PH: 6 (ref 5.0–8.0)
PROTEIN: NEGATIVE mg/dL
Specific Gravity, Urine: 1.018 (ref 1.005–1.030)

## 2018-06-28 LAB — CHLAMYDIA/NGC RT PCR (ARMC ONLY)
Chlamydia Tr: NOT DETECTED
N GONORRHOEAE: NOT DETECTED

## 2018-06-28 MED ORDER — FEXOFENADINE HCL 180 MG PO TABS: 180.0000 mg | ORAL_TABLET | Freq: Every day | ORAL | 0 refills | Status: DC

## 2018-06-28 MED ORDER — HYDROCOD POLST-CPM POLST ER 10-8 MG/5ML PO SUER
5.0000 mL | Freq: Once | ORAL | Status: AC
  Administered 2018-06-28: 5 mL via ORAL
  Filled 2018-06-28: qty 5

## 2018-06-28 MED ORDER — BACLOFEN 10 MG PO TABS: 10.0000 mg | ORAL_TABLET | Freq: Three times a day (TID) | ORAL | 0 refills | Status: DC

## 2018-06-28 MED ORDER — GUAIFENESIN-CODEINE 100-10 MG/5ML PO SOLN: 10.0000 mL | Freq: Three times a day (TID) | ORAL | 0 refills | Status: DC | PRN

## 2018-06-28 NOTE — ED Notes (Signed)
Sinus  Drainage  And  Congestion     Coughing  And  sorethroat x  2  Weeks   l  Side  Pain   Cloudy  Urine  As  Well  - urinary symptoms x  2  Months

## 2018-06-28 NOTE — ED Provider Notes (Signed)
[facility] Emergency Department Provider Note  ____________________________________________   None    (approximate)  I have reviewed the triage vital signs and the nursing notes.   HISTORY  Chief Complaint Flank Pain and URI   HPI Jonathan Deleon is a 47 y.o. adult who presents to the emergency department who presents to the emergency department for treatment and evaluation of sinus congestion, chest congestion, sore throat, cough, and left flank pain. No relief with allergy medications. Flank pain gets worse if she drinks "water, but nothing else seems to make it hurt." Exercise and movement triggers the pain as well. No relief with over the counter medications.   Past Medical History:  Diagnosis Date  . HIV (human immunodeficiency virus infection) (HCC)   . Hypertension   . Seizures (HCC)   . Stroke [facility])     Patient Active Problem List   Diagnosis Date Noted  . Seizure (HCC) 03/18/2016    Past Surgical History:  Procedure Laterality Date  . BRAIN SURGERY    . COSMETIC SURGERY      Prior to Admission medications   Medication Sig Start Date End Date Taking? Authorizing Provider  albuterol (PROVENTIL HFA;VENTOLIN HFA) 108 (90 Base) MCG/ACT inhaler Inhale 2 puffs into the lungs every 4 (four) hours as needed for wheezing or shortness of breath. 06/29/17   Irean Hong, MD  aspirin 325 MG tablet Take 325 mg by mouth daily.    [provider]  azelastine (ASTELIN) 0.1 % nasal spray Place 2 sprays into both nostrils 2 (two) times daily. Use in each nostril as directed 07/31/17   Sherrie Mustache Roselyn Bering, PA-C  azithromycin (ZITHROMAX Z-PAK) 250 MG tablet Take 2 tablets (500 mg) on  Day 1,  followed by 1 tablet (250 mg) once daily on Days 2 through 5. 10/23/17   Emily Filbert, MD  baclofen (LIORESAL) 10 MG tablet Take 1 tablet (10 mg total) by mouth 3 (three) times daily. 06/28/18   Anastashia Westerfeld B, FNP  efavirenz-emtricitabine-tenofovir  (ATRIPLA) 600-200-300 MG per tablet Take 1 tablet by mouth at bedtime.    [provider]  estradiol (ESTRACE) 1 MG tablet Take 4 mg by mouth daily.    [provider]  fexofenadine (ALLEGRA) 180 MG tablet Take 1 tablet (180 mg total) by mouth daily. 06/28/18   Velicia Dejager B, FNP  fluticasone (FLONASE) 50 MCG/ACT nasal spray Place 1 spray into both nostrils daily. 09/22/17 09/22/18  Fisher, Roselyn Bering, PA-C  guaiFENesin-codeine 100-10 MG/5ML syrup Take 10 mLs by mouth 3 (three) times daily as needed. 06/28/18   Ancelmo Hunt, Rulon Eisenmenger B, FNP  HYDROcodone-acetaminophen (NORCO) 5-325 MG tablet Take 1 tablet by mouth every 8 (eight) hours as needed for moderate pain. 05/10/18   Evon Slack, PA-C  levETIRAcetam (KEPPRA) 500 MG tablet Take 1 tablet (500 mg total) by mouth 2 (two) times daily. 01/30/18 03/01/18  Willy Eddy, MD  magic mouthwash SOLN Take 5 mLs by mouth 3 (three) times daily as needed for mouth pain. 06/29/17   Irean Hong, MD  spironolactone (ALDACTONE) 100 MG tablet Take 100 mg by mouth every morning. 09/14/14   [provider]  sulfamethoxazole-trimethoprim (BACTRIM DS,SEPTRA DS) 800-160 MG tablet Take 1 tablet by mouth 2 (two) times daily. 04/26/18   Enid Derry, PA-C  triamcinolone (KENALOG) 0.025 % ointment Apply 1 application topically 2 (two) times daily. 04/26/18   Enid Derry, PA-C    Allergies Penicillins and Shellfish allergy  History reviewed. No pertinent family history.  Social History Social History   Tobacco Use  . Smoking status: Never Smoker  . Smokeless tobacco: Former Engineer, water Use Topics  . Alcohol use: Yes    Comment: occas.   . Drug use: No    Review of Systems  Constitutional: No fever/chills. Eyes: No visual changes. ENT: No sore throat. Cardiovascular: Denies chest pain. Positive for chest congestion. Respiratory: Denies shortness of breath. Gastrointestinal: No abdominal pain.  No nausea, no vomiting.  No  diarrhea.  No constipation. Genitourinary: Negative for dysuria. Musculoskeletal: Positive for left flank pain. Negative for CVA tenderness. Skin: Negative for rash. Neurological: Negative for headaches, focal weakness or numbness. ____________________________________________   PHYSICAL EXAM:  VITAL SIGNS: ED Triage Vitals  Enc Vitals Group     BP 06/28/18 1833 118/77     Pulse Rate 06/28/18 1833 83     Resp 06/28/18 1833 18     Temp 06/28/18 1833 98.1 F (36.7 C)     Temp Source 06/28/18 1833 Oral     SpO2 06/28/18 1833 99 %     Weight 06/28/18 1834 150 lb (68 kg)     Height 06/28/18 1834 5\' 9"  (1.753 m)     Head Circumference --      Peak Flow --      Pain Score 06/28/18 1834 10     Pain Loc --      Pain Edu? --      Excl. in GC? --     Constitutional: Alert and oriented. Well appearing and in no acute distress. Eyes: Conjunctivae are normal. PERRL. EOMI. Head: Atraumatic. Nose: Sinus congestion without rhinnorhea. Mouth/Throat: Mucous membranes are moist.  Oropharynx non-erythematous. Cobblestone drainage present. Neck: No stridor.   Cardiovascular: Normal rate, regular rhythm. Grossly normal heart sounds.  Good peripheral circulation. Respiratory: Normal respiratory effort.  No retractions. Lungs CTAB. Gastrointestinal: Soft and nontender. No distention. No abdominal bruits. No CVA tenderness. Musculoskeletal: No lower extremity tenderness nor edema.  No joint effusions. Neurologic:  Normal speech and language. No gross focal neurologic deficits are appreciated. No gait instability. Skin:  Skin is warm, dry and intact. No rash noted. Psychiatric: Mood and affect are normal. Speech and behavior are normal.  ____________________________________________   LABS (all labs ordered are listed, but only abnormal results are displayed)  Labs Reviewed  URINALYSIS, COMPLETE (UACMP) WITH MICROSCOPIC - Abnormal; Notable for the following components:      Result Value    Color, Urine YELLOW (*)    APPearance CLEAR (*)    Bacteria, UA RARE (*)    All other components within normal limits  CHLAMYDIA/NGC RT PCR ([facility] ONLY)   ____________________________________________  EKG  Not indicated. ____________________________________________  RADIOLOGY  ED MD interpretation: Renal ultrasound shows a normal appearance of the kidney without hydronephrosis, mass, or obstruction.  Official radiology report(s): US Renal  Result Date: 06/28/2018 CLINICAL DATA:  Left flank pain EXAM: RENAL / URINARY TRACT ULTRASOUND COMPLETE COMPARISON:  CT abdomen 02/05/2016 and abdominal radiographs from 05/04/2017 FINDINGS: Right Kidney: Renal measurements: 10.2 by 3.6 by 5.1 cm = volume: 97 mL . Echogenicity within normal limits. No mass or hydronephrosis visualized. Left Kidney: Renal measurements: 10.1 by 5.5 by 4.5 cm = volume: 130 mL. Echogenicity within normal limits. No mass or hydronephrosis visualized. Bladder: Appears normal for degree of bladder distention. IMPRESSION: 1. Normal sonographic appearance of the kidneys. No renal cause for the patient's left flank pain is identified. Electronically Signed  By: Gaylyn RongWalter  Liebkemann M.D.   On: 06/28/2018 20:57    ____________________________________________   PROCEDURES  Procedure(s) performed: None  Procedures  Critical Care performed: No  ____________________________________________   INITIAL IMPRESSION / ASSESSMENT AND PLAN / ED COURSE  As part of my medical decision making, I reviewed the following data within the electronic MEDICAL RECORD NUMBER    47 year old male presenting to the emergency department for treatment and evaluation of multiple medical complaints.  Symptoms and exam are most consistent with seasonal allergies and left flank pain most likely muscular in nature.  Ultrasound of the kidney does not reveal any sign of obstruction or hydronephrosis.  Urinalysis shows rare bacteria but is  otherwise negative for signs of urethritis, acute cystitis, or STD.  Patient will be prescribed baclofen, fexofenadine, and guaifenesin with codeine.  She was encouraged to follow-up with primary care for symptoms that are not improving over the next few days.  She will return to the emergency department for symptoms of change or worsen if unable to schedule an appointment.      ____________________________________________   FINAL CLINICAL IMPRESSION(S) / ED DIAGNOSES  Final diagnoses:  Left flank pain  Seasonal allergies     ED Discharge Orders         Ordered    fexofenadine (ALLEGRA) 180 MG tablet  Daily,   Status:  Discontinued     06/28/18 2116    guaiFENesin-codeine 100-10 MG/5ML syrup  3 times daily PRN     06/28/18 2116    baclofen (LIORESAL) 10 MG tablet  3 times daily,   Status:  Discontinued     06/28/18 2116    baclofen (LIORESAL) 10 MG tablet  3 times daily     06/28/18 2118    fexofenadine (ALLEGRA) 180 MG tablet  Daily     06/28/18 2118           Note:  This document was prepared using Dragon voice recognition software and may include unintentional dictation errors.    Chinita Pesterriplett, Kalden Wanke B, FNP 06/28/18 [phone]    Jene EveryKinner, Robert, MD 06/29/18 [phone]

## 2018-06-28 NOTE — ED Triage Notes (Signed)
Pt to ED from home c/o left flank pain x2 months, pain in side with urination, also c/o URI symptoms for same time.  Pt ambulatory, chest rise even and unlabored, in NAD at this time.

## 2018-07-20 ENCOUNTER — Emergency Department
Admission: EM | Admit: 2018-07-20 | Discharge: 2018-07-20 | Disposition: A | Discharge status: Home / Self Care | Payer: Medicaid Other | Source: Home / Self Care | Attending: Emergency Medicine | Admitting: Emergency Medicine

## 2018-07-20 ENCOUNTER — Emergency Department
Admission: EM | Admit: 2018-07-20 | Discharge: 2018-07-20 | Discharge status: Against Medical Advice | Payer: Medicaid Other | Attending: Emergency Medicine | Admitting: Emergency Medicine

## 2018-07-20 ENCOUNTER — Emergency Department: Payer: Medicaid Other

## 2018-07-20 ENCOUNTER — Other Ambulatory Visit: Payer: Self-pay

## 2018-07-20 ENCOUNTER — Encounter: Payer: Self-pay | Admitting: Emergency Medicine

## 2018-07-20 DIAGNOSIS — R079 Chest pain, unspecified: Secondary | ICD-10-CM | POA: Diagnosis not present

## 2018-07-20 DIAGNOSIS — J0100 Acute maxillary sinusitis, unspecified: Secondary | ICD-10-CM

## 2018-07-20 DIAGNOSIS — Z79899 Other long term (current) drug therapy: Secondary | ICD-10-CM | POA: Insufficient documentation

## 2018-07-20 DIAGNOSIS — I10 Essential (primary) hypertension: Secondary | ICD-10-CM | POA: Insufficient documentation

## 2018-07-20 DIAGNOSIS — Z5321 Procedure and treatment not carried out due to patient leaving prior to being seen by health care provider: Secondary | ICD-10-CM | POA: Diagnosis not present

## 2018-07-20 DIAGNOSIS — J069 Acute upper respiratory infection, unspecified: Secondary | ICD-10-CM

## 2018-07-20 DIAGNOSIS — J019 Acute sinusitis, unspecified: Secondary | ICD-10-CM

## 2018-07-20 DIAGNOSIS — R05 Cough: Secondary | ICD-10-CM | POA: Insufficient documentation

## 2018-07-20 DIAGNOSIS — Z7982 Long term (current) use of aspirin: Secondary | ICD-10-CM | POA: Insufficient documentation

## 2018-07-20 DIAGNOSIS — J3489 Other specified disorders of nose and nasal sinuses: Secondary | ICD-10-CM | POA: Diagnosis present

## 2018-07-20 MED ORDER — ALBUTEROL SULFATE HFA 108 (90 BASE) MCG/ACT IN AERS: 2.0000 | INHALATION_SPRAY | Freq: Four times a day (QID) | RESPIRATORY_TRACT | 2 refills | Status: DC | PRN

## 2018-07-20 MED ORDER — HYDROCOD POLST-CPM POLST ER 10-8 MG/5ML PO SUER
5.0000 mL | Freq: Once | ORAL | Status: AC
  Administered 2018-07-20: 5 mL via ORAL
  Filled 2018-07-20: qty 5

## 2018-07-20 MED ORDER — HYDROCOD POLST-CPM POLST ER 10-8 MG/5ML PO SUER: 5.0000 mL | Freq: Two times a day (BID) | ORAL | 0 refills | Status: DC | PRN

## 2018-07-20 MED ORDER — LEVOFLOXACIN 500 MG PO TABS
500.0000 mg | ORAL_TABLET | Freq: Once | ORAL | Status: AC
  Administered 2018-07-20: 500 mg via ORAL
  Filled 2018-07-20: qty 1

## 2018-07-20 MED ORDER — LEVOFLOXACIN 500 MG PO TABS: 500.0000 mg | ORAL_TABLET | Freq: Every day | ORAL | 0 refills | Status: AC

## 2018-07-20 NOTE — ED Provider Notes (Signed)
[facility] EMERGENC[facility] Provider Note   CSN: [id number] Arrival date & time: 07/20/18  1834     History   Chief Complaint Chief Complaint  Patient presents with  . URI    HPI Jonathan Deleon is a 47 y.o. adult presents emergency department evaluation of cough and chest congestion for 4 weeks with no improvement.  Patient is also complaining of significant sinus pain and pressure.  No fevers.  She feels that she has tightness in the chest with wheezing.  Cough is occasionally dry and also productive.  She has had thick mucus from the sinus passages.  She has not been on any recent antibiotics.  She denies any fevers, abdominal pain, nausea or vomiting.  HPI  Past Medical History:  Diagnosis Date  . HIV (human immunodeficiency virus infection) (HCC)   . Hypertension   . Seizures (HCC)   . Stroke [facility])     Patient Active Problem List   Diagnosis Date Noted  . Seizure (HCC) 03/18/2016    Past Surgical History:  Procedure Laterality Date  . BRAIN SURGERY    . COSMETIC SURGERY          Home Medications    Prior to Admission medications   Medication Sig Start Date End Date Taking? Authorizing Provider  albuterol (PROVENTIL HFA;VENTOLIN HFA) 108 (90 Base) MCG/ACT inhaler Inhale 2 puffs into the lungs every 6 (six) hours as needed for wheezing or shortness of breath. 07/20/18   Evon Slack, PA-C  aspirin 325 MG tablet Take 325 mg by mouth daily.    [provider]  azelastine (ASTELIN) 0.1 % nasal spray Place 2 sprays into both nostrils 2 (two) times daily. Use in each nostril as directed 07/31/17   Sherrie Mustache Roselyn Bering, PA-C  azithromycin (ZITHROMAX Z-PAK) 250 MG tablet Take 2 tablets (500 mg) on  Day 1,  followed by 1 tablet (250 mg) once daily on Days 2 through 5. 10/23/17   Emily Filbert, MD  baclofen (LIORESAL) 10 MG tablet Take 1 tablet (10 mg total) by mouth 3 (three) times daily. 06/28/18   Triplett, Rulon Eisenmenger B, FNP    chlorpheniramine-HYDROcodone (TUSSIONEX PENNKINETIC ER) 10-8 MG/5ML SUER Take 5 mLs by mouth every 12 (twelve) hours as needed for cough. 07/20/18   Evon Slack, PA-C  efavirenz-emtricitabine-tenofovir (ATRIPLA) 600-200-300 MG per tablet Take 1 tablet by mouth at bedtime.    [provider]  estradiol (ESTRACE) 1 MG tablet Take 4 mg by mouth daily.    [provider]  fexofenadine (ALLEGRA) 180 MG tablet Take 1 tablet (180 mg total) by mouth daily. 06/28/18   Triplett, Cari B, FNP  fluticasone (FLONASE) 50 MCG/ACT nasal spray Place 1 spray into both nostrils daily. 09/22/17 09/22/18  Fisher, Roselyn Bering, PA-C  guaiFENesin-codeine 100-10 MG/5ML syrup Take 10 mLs by mouth 3 (three) times daily as needed. 06/28/18   Triplett, Rulon Eisenmenger B, FNP  HYDROcodone-acetaminophen (NORCO) 5-325 MG tablet Take 1 tablet by mouth every 8 (eight) hours as needed for moderate pain. 05/10/18   Evon Slack, PA-C  levETIRAcetam (KEPPRA) 500 MG tablet Take 1 tablet (500 mg total) by mouth 2 (two) times daily. 01/30/18 03/01/18  Willy Eddy, MD  levofloxacin (LEVAQUIN) 500 MG tablet Take 1 tablet (500 mg total) by mouth daily for 10 days. 07/20/18 07/30/18  Evon Slack, PA-C  magic mouthwash SOLN Take 5 mLs by mouth 3 (three) times daily as needed for mouth pain. 06/29/17  Irean Hong, MD  spironolactone (ALDACTONE) 100 MG tablet Take 100 mg by mouth every morning. 09/14/14   [provider]  sulfamethoxazole-trimethoprim (BACTRIM DS,SEPTRA DS) 800-160 MG tablet Take 1 tablet by mouth 2 (two) times daily. 04/26/18   Enid Derry, PA-C  triamcinolone (KENALOG) 0.025 % ointment Apply 1 application topically 2 (two) times daily. 04/26/18   Enid Derry, PA-C    Family History No family history on file.  Social History Social History   Tobacco Use  . Smoking status: Never Smoker  . Smokeless tobacco: Former Engineer, water Use Topics  . Alcohol use: Yes    Comment: occas.   .  Drug use: No     Allergies   Shellfish allergy and Penicillins   Review of Systems Review of Systems  Constitutional: Negative for fever.  HENT: Positive for congestion, rhinorrhea, sinus pressure, sinus pain and sore throat. Negative for ear discharge, trouble swallowing and voice change.   Respiratory: Positive for cough and wheezing. Negative for shortness of breath and stridor.   Cardiovascular: Negative for chest pain.  Gastrointestinal: Negative for abdominal pain, diarrhea, nausea and vomiting.  Genitourinary: Negative for dysuria and flank pain.  Musculoskeletal: Positive for myalgias. Negative for back pain.  Skin: Negative for rash.  Neurological: Negative for dizziness and headaches.     Physical Exam Updated Vital Signs BP 115/71 (BP Location: Left Arm)   Pulse 93   Temp 98.7 F (37.1 C) (Oral)   Ht 5\' 8"  (1.727 m)   Wt 72.6 kg   SpO2 98%   BMI 24.33 kg/m   Physical Exam Constitutional:      General: She is not in acute distress.    Appearance: She is well-developed.  HENT:     Head: Normocephalic and atraumatic.     Jaw: No trismus.     Comments: Positive maxillary sinus tenderness bilaterally.  Pharynx is normal    Right Ear: Hearing, tympanic membrane, ear canal and external ear normal.     Left Ear: Hearing, tympanic membrane, ear canal and external ear normal.     Nose: Rhinorrhea present.     Right Sinus: No maxillary sinus tenderness or frontal sinus tenderness.     Left Sinus: No maxillary sinus tenderness or frontal sinus tenderness.     Mouth/Throat:     Pharynx: No oropharyngeal exudate, posterior oropharyngeal erythema or uvula swelling.     Tonsils: No tonsillar abscesses.  Eyes:     Conjunctiva/sclera: Conjunctivae normal.  Neck:     Musculoskeletal: Normal range of motion.  Cardiovascular:     Rate and Rhythm: Normal rate and regular rhythm.  Pulmonary:     Effort: No respiratory distress.     Breath sounds: No stridor. Wheezing  (subtle) present.  Chest:     Chest wall: No tenderness.  Abdominal:     General: There is no distension.     Palpations: Abdomen is soft.     Tenderness: There is no abdominal tenderness. There is no guarding.  Musculoskeletal: Normal range of motion.  Skin:    General: Skin is warm and dry.     Findings: No rash.  Neurological:     Mental Status: She is alert and oriented to person, place, and time.  Psychiatric:        Behavior: Behavior normal.        Thought Content: Thought content normal.        Judgment: Judgment normal.      ED  Treatments / Results  Labs (all labs ordered are listed, but only abnormal results are displayed) Labs Reviewed - No data to display  EKG None  Radiology Dg Chest 2 View  Result Date: 07/20/2018 CLINICAL DATA:  Cough for the past 2 weeks. EXAM: CHEST - 2 VIEW COMPARISON:  Chest x-ray dated April 26, 2018. FINDINGS: The heart size and mediastinal contours are within normal limits. Both lungs are clear. The visualized skeletal structures are unremarkable. IMPRESSION: No active cardiopulmonary disease. Electronically Signed   By: Obie DredgeWilliam T Derry M.D.   On: 07/20/2018 20:32    Procedures Procedures (including critical care time)  Medications Ordered in ED Medications  chlorpheniramine-HYDROcodone (TUSSIONEX) 10-8 MG/5ML suspension 5 mL (has no administration in time range)  levofloxacin (LEVAQUIN) tablet 500 mg (has no administration in time range)     Initial Impression / Assessment and Plan / ED Course  I have reviewed the triage vital signs and the nursing notes.  Pertinent labs & imaging results that were available during my care of the patient were reviewed by me and considered in my medical decision making (see chart for details).     47 year old male with recurrent maxillary sinusitis and bronchitis.  Chest x-ray showed no evidence of acute cardiopulmonary process or infiltrates.  She has very little wheezing on exam.   Will give prescription for albuterol inhaler.  She is started on Levaquin for sinusitis.  She is given cough medication to take as needed.  She will follow-up with PCP if no improvement.  She understands signs symptoms return to ED for.  Final Clinical Impressions(s) / ED Diagnoses   Final diagnoses:  Viral upper respiratory tract infection  Acute non-recurrent maxillary sinusitis    ED Discharge Orders         Ordered    chlorpheniramine-HYDROcodone (TUSSIONEX PENNKINETIC ER) 10-8 MG/5ML SUER  Every 12 hours PRN     07/20/18 2136    levofloxacin (LEVAQUIN) 500 MG tablet  Daily     07/20/18 2136    albuterol (PROVENTIL HFA;VENTOLIN HFA) 108 (90 Base) MCG/ACT inhaler  Every 6 hours PRN     07/20/18 2136           Ronnette JuniperGaines, Thomas C, PA-C 07/20/18 2138    Jeanmarie PlantMcShane, James A, MD 07/20/18 2230

## 2018-07-20 NOTE — Discharge Instructions (Addendum)
Please take medications as prescribed.  Follow-up with primary care provider or ENT provider in 5 to 7 days if no improvement.

## 2018-07-20 NOTE — ED Triage Notes (Addendum)
Cough x 2 weeks. Green sinus drainage x 2 days.

## 2018-07-20 NOTE — ED Triage Notes (Addendum)
Pt in via POV with complaints ongoing chest pain, sinus congestion/drainage/pressure.  Vitals WDL, NAD noted at this time.

## 2018-08-11 ENCOUNTER — Encounter: Payer: Self-pay | Admitting: Emergency Medicine

## 2018-08-11 ENCOUNTER — Emergency Department: Payer: Medicaid Other

## 2018-08-11 ENCOUNTER — Emergency Department
Admission: EM | Admit: 2018-08-11 | Discharge: 2018-08-11 | Disposition: A | Discharge status: Home / Self Care | Payer: Medicaid Other | Attending: Emergency Medicine | Admitting: Emergency Medicine

## 2018-08-11 DIAGNOSIS — I10 Essential (primary) hypertension: Secondary | ICD-10-CM | POA: Insufficient documentation

## 2018-08-11 DIAGNOSIS — Z79899 Other long term (current) drug therapy: Secondary | ICD-10-CM | POA: Insufficient documentation

## 2018-08-11 DIAGNOSIS — J320 Chronic maxillary sinusitis: Secondary | ICD-10-CM | POA: Diagnosis not present

## 2018-08-11 DIAGNOSIS — R51 Headache: Secondary | ICD-10-CM | POA: Diagnosis present

## 2018-08-11 DIAGNOSIS — Z87891 Personal history of nicotine dependence: Secondary | ICD-10-CM | POA: Insufficient documentation

## 2018-08-11 DIAGNOSIS — F640 Transsexualism: Secondary | ICD-10-CM | POA: Diagnosis not present

## 2018-08-11 DIAGNOSIS — Z7982 Long term (current) use of aspirin: Secondary | ICD-10-CM | POA: Diagnosis not present

## 2018-08-11 DIAGNOSIS — Z21 Asymptomatic human immunodeficiency virus [HIV] infection status: Secondary | ICD-10-CM | POA: Diagnosis not present

## 2018-08-11 DIAGNOSIS — J341 Cyst and mucocele of nose and nasal sinus: Secondary | ICD-10-CM | POA: Diagnosis not present

## 2018-08-11 LAB — INFLUENZA PANEL BY PCR (TYPE A & B)
Influenza A By PCR: NEGATIVE
Influenza B By PCR: NEGATIVE

## 2018-08-11 LAB — GROUP A STREP BY PCR: Group A Strep by PCR: NOT DETECTED

## 2018-08-11 MED ORDER — CLARITHROMYCIN ER 500 MG PO TB24: 1000.0000 mg | ORAL_TABLET | Freq: Every day | ORAL | 0 refills | Status: AC

## 2018-08-11 MED ORDER — FLUCONAZOLE 150 MG PO TABS: ORAL_TABLET | ORAL | 0 refills | Status: DC
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
## 2018-08-11 NOTE — ED Triage Notes (Signed)
Pt reports sinus pressure and pain for a month.

## 2018-08-11 NOTE — Discharge Instructions (Addendum)
Follow-up with your regular doctor to discuss the CT results.  Return emergency department worsening.  Take medications as prescribed.

## 2018-08-11 NOTE — ED Notes (Signed)
See triage note  Presents with sinus pressure   States now having green drainage  No fever
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
## 2018-10-11 ENCOUNTER — Other Ambulatory Visit: Payer: Self-pay

## 2018-10-11 ENCOUNTER — Encounter: Payer: Self-pay | Admitting: Emergency Medicine

## 2018-10-11 DIAGNOSIS — Z5321 Procedure and treatment not carried out due to patient leaving prior to being seen by health care provider: Secondary | ICD-10-CM | POA: Diagnosis not present

## 2018-10-11 DIAGNOSIS — R079 Chest pain, unspecified: Secondary | ICD-10-CM | POA: Insufficient documentation

## 2018-10-11 LAB — URINALYSIS, COMPLETE (UACMP) WITH MICROSCOPIC
Bilirubin Urine: NEGATIVE
Glucose, UA: NEGATIVE mg/dL
Hgb urine dipstick: NEGATIVE
Ketones, ur: NEGATIVE mg/dL
Leukocytes,Ua: NEGATIVE
Nitrite: NEGATIVE
Protein, ur: NEGATIVE mg/dL
Specific Gravity, Urine: 1.018 (ref 1.005–1.030)
pH: 7 (ref 5.0–8.0)

## 2018-10-11 LAB — COMPREHENSIVE METABOLIC PANEL
ALK PHOS: 70 U/L (ref 38–126)
ALT: 12 U/L (ref 0–44)
AST: 20 U/L (ref 15–41)
Albumin: 3.9 g/dL (ref 3.5–5.0)
Anion gap: 6 (ref 5–15)
BILIRUBIN TOTAL: 0.5 mg/dL (ref 0.3–1.2)
BUN: 21 mg/dL — ABNORMAL HIGH (ref 6–20)
CO2: 22 mmol/L (ref 22–32)
Calcium: 8.4 mg/dL — ABNORMAL LOW (ref 8.9–10.3)
Chloride: 111 mmol/L (ref 98–111)
Creatinine, Ser: 1.2 mg/dL (ref 0.61–1.24)
GFR calc Af Amer: >60 mL/min (ref >60)
GFR calc non Af Amer: >60 mL/min (ref >60)
Glucose, Bld: 144 mg/dL — ABNORMAL HIGH (ref 70–99)
Potassium: 3.8 mmol/L (ref 3.5–5.1)
Sodium: 139 mmol/L (ref 135–145)
TOTAL PROTEIN: 7.1 g/dL (ref 6.5–8.1)

## 2018-10-11 LAB — CBC
HCT: 42.3 % (ref 39.0–52.0)
Hemoglobin: 13.8 g/dL (ref 13.0–17.0)
MCH: 28.2 pg (ref 26.0–34.0)
MCHC: 32.6 g/dL (ref 30.0–36.0)
MCV: 86.3 fL (ref 80.0–100.0)
PLATELETS: 241 10*3/uL (ref 150–400)
RBC: 4.9 MIL/uL (ref 4.22–5.81)
RDW: 13.1 % (ref 11.5–15.5)
WBC: 6 10*3/uL (ref 4.0–10.5)
nRBC: 0 % (ref 0.0–0.2)

## 2018-10-11 LAB — TROPONIN I: Troponin I: <0.03 ng/mL (ref <0.03)

## 2018-10-11 LAB — LIPASE, BLOOD: Lipase: 36 U/L (ref 11–51)

## 2018-10-11 NOTE — ED Triage Notes (Signed)
Pt reports chest pain that started for once month, worsening; pain intermittently radiating down both arms and left leg; also reports abd pain x 1 week; pt would like to be checked for UTI and her anus checked;  also having sore throat for 5 days; pt talking in complete coherent sentences;

## 2018-10-12 ENCOUNTER — Emergency Department
Admission: EM | Admit: 2018-10-12 | Discharge: 2018-10-12 | Disposition: A | Discharge status: Home / Self Care | Payer: Medicaid Other | Attending: Emergency Medicine | Admitting: Emergency Medicine

## 2018-10-13 ENCOUNTER — Other Ambulatory Visit: Payer: Self-pay

## 2018-10-13 ENCOUNTER — Encounter: Payer: Self-pay | Admitting: *Deleted

## 2018-10-13 DIAGNOSIS — I10 Essential (primary) hypertension: Secondary | ICD-10-CM | POA: Diagnosis not present

## 2018-10-13 DIAGNOSIS — Z7982 Long term (current) use of aspirin: Secondary | ICD-10-CM | POA: Diagnosis not present

## 2018-10-13 DIAGNOSIS — R300 Dysuria: Secondary | ICD-10-CM | POA: Diagnosis present

## 2018-10-13 DIAGNOSIS — Z79899 Other long term (current) drug therapy: Secondary | ICD-10-CM | POA: Diagnosis not present

## 2018-10-13 LAB — URINALYSIS, COMPLETE (UACMP) WITH MICROSCOPIC
Bacteria, UA: NONE SEEN
Bilirubin Urine: NEGATIVE
GLUCOSE, UA: NEGATIVE mg/dL
Hgb urine dipstick: NEGATIVE
Ketones, ur: NEGATIVE mg/dL
Leukocytes,Ua: NEGATIVE
Nitrite: NEGATIVE
Protein, ur: NEGATIVE mg/dL
Specific Gravity, Urine: 1.01 (ref 1.005–1.030)
pH: 7 (ref 5.0–8.0)

## 2018-10-13 NOTE — ED Triage Notes (Signed)
Pt reports low back pain with burning with urination.  Pt was seen here 2 days ago with similar sx.  Pt also reports neck pain   No known injury.  Pt alert.

## 2018-10-14 ENCOUNTER — Emergency Department
Admission: EM | Admit: 2018-10-14 | Discharge: 2018-10-14 | Disposition: A | Discharge status: Home / Self Care | Payer: Medicaid Other | Attending: Emergency Medicine | Admitting: Emergency Medicine

## 2018-10-14 DIAGNOSIS — R300 Dysuria: Secondary | ICD-10-CM

## 2018-10-14 LAB — CHLAMYDIA/NGC RT PCR (ARMC ONLY)
Chlamydia Tr: NOT DETECTED
N gonorrhoeae: NOT DETECTED

## 2018-10-14 MED ORDER — DOXYCYCLINE HYCLATE 100 MG PO CAPS: 100.0000 mg | ORAL_CAPSULE | Freq: Two times a day (BID) | ORAL | 0 refills | Status: DC

## 2018-10-14 MED ORDER — DOXYCYCLINE HYCLATE 100 MG PO TABS
100.0000 mg | ORAL_TABLET | Freq: Once | ORAL | Status: AC
  Administered 2018-10-14: 100 mg via ORAL
  Filled 2018-10-14: qty 1

## 2018-10-14 MED ORDER — CEFTRIAXONE SODIUM 250 MG IJ SOLR
250.0000 mg | Freq: Once | INTRAMUSCULAR | Status: AC
  Administered 2018-10-14: 250 mg via INTRAMUSCULAR
  Filled 2018-10-14: qty 250

## 2018-10-14 NOTE — ED Notes (Signed)
Pt

## 2018-10-14 NOTE — ED Provider Notes (Signed)
[facility] Emergency Department Provider Note   ____________________________________________   First MD Initiated Contact with Patient 10/14/18 [phone]     (approximate)  I have reviewed the triage vital signs and the nursing notes.   HISTORY  Chief Complaint Back Pain and Neck Pain   HPI [place] A Golding is a 48 y.o. adult who when I see them complains of continued sinus drainage and pain in the back with some dysuria.  Patient reports that they are drinking a lot of water.  Urine was clear.  There is no fever.  Although in the morning there is some sweating.  No nasal discharge.  Patient has had cosmetic surgery with breast augmentation but does not have the vagina.  There are no rectal discharge or sores.  Patient reports no sexual activity currently.  Patient does complain of some nausea and achiness after being switched from her old anti-HIV drug to this new one.  Patient has not told the HIV doctor.         Past Medical History:  Diagnosis Date  . HIV (human immunodeficiency virus infection) (HCC)   . Hypertension   . Seizures (HCC)   . Stroke [facility])     Patient Active Problem List   Diagnosis Date Noted  . Seizure (HCC) 03/18/2016    Past Surgical History:  Procedure Laterality Date  . BRAIN SURGERY    . COSMETIC SURGERY      Prior to Admission medications   Medication Sig Start Date End Date Taking? Authorizing Provider  aspirin 325 MG tablet Take 325 mg by mouth daily.    [provider]  doxycycline (VIBRAMYCIN) 100 MG capsule Take 1 capsule (100 mg total) by mouth 2 (two) times daily. 10/14/18   Arnaldo Natal, MD  efavirenz-emtricitabine-tenofovir (ATRIPLA) 600-200-300 MG per tablet Take 1 tablet by mouth at bedtime.    [provider]  estradiol (ESTRACE) 1 MG tablet Take 4 mg by mouth daily.    [provider]  fluconazole (DIFLUCAN) 150 MG tablet Take one now and one in a week 08/11/18   Sherrie Mustache, Roselyn Bering,  PA-C  levETIRAcetam (KEPPRA) 500 MG tablet Take 1 tablet (500 mg total) by mouth 2 (two) times daily. 01/30/18 10/11/18  Willy Eddy, MD  spironolactone (ALDACTONE) 100 MG tablet Take 100 mg by mouth every morning. 09/14/14   [provider]    Allergies Shellfish allergy and Penicillins  No family history on file.  Social History Social History   Tobacco Use  . Smoking status: Never Smoker  . Smokeless tobacco: Former Engineer, water Use Topics  . Alcohol use: Yes    Comment: occas.   . Drug use: No    Review of Systems  Constitutional: No fever/chills Eyes: No visual changes. ENT: No sore throat. Cardiovascular: Denies chest pain. Respiratory: Denies shortness of breath. Gastrointestinal: No abdominal pain.  No nausea, no vomiting.  No diarrhea.  No constipation. Genitourinary:  dysuria. Musculoskeletal: Negative for back pain. Skin: Negative for rash. Neurological: Negative for headaches, focal weakness    ____________________________________________   PHYSICAL EXAM:  VITAL SIGNS: ED Triage Vitals  Enc Vitals Group     BP 10/13/18 2120 (!) 120/99     Pulse Rate 10/13/18 2120 86     Resp 10/13/18 2120 18     Temp 10/13/18 2120 98 F (36.7 C)     Temp Source 10/13/18 2120 Oral     SpO2 10/13/18 2120 98 %  Weight 10/13/18 2121 165 lb (74.8 kg)     Height 10/13/18 2121 5\' 9"  (1.753 m)     Head Circumference --      Peak Flow --      Pain Score 10/13/18 2129 0     Pain Loc --      Pain Edu? --      Excl. in GC? --     Constitutional: Alert and oriented. Well appearing and in no acute distress. Eyes: Conjunctivae are normal.  Disconjugate gaze which is old Head: Atraumatic. Nose: No congestion/rhinnorhea. Mouth/Throat: Mucous membranes are moist.  Oropharynx non-erythematous. Neck: No stridor.  Cardiovascular: Normal rate, regular rhythm. Grossly normal heart sounds.  Good peripheral circulation. Respiratory: Normal respiratory effort.   No retractions. Lungs CTAB. Gastrointestinal: Soft and nontender. No distention. No abdominal bruits. No CVA tenderness. Musculoskeletal: No lower extremity tenderness nor edema Neurologic:  Normal speech and language. No gross focal neurologic deficits are appreciated. No gait instability. Skin:  Skin is warm, dry and intact. No rash noted. Psychiatric: Mood and affect are normal. Speech and behavior are normal.  ____________________________________________   LABS (all labs ordered are listed, but only abnormal results are displayed)  Labs Reviewed  URINALYSIS, COMPLETE (UACMP) WITH MICROSCOPIC - Abnormal; Notable for the following components:      Result Value   Color, Urine STRAW (*)    APPearance CLEAR (*)    All other components within normal limits  CHLAMYDIA/NGC RT PCR ([facility] ONLY)   ____________________________________________  EKG   ____________________________________________  RADIOLOGY  ED MD interpretation:    Official radiology report(s): No results found.  ____________________________________________   PROCEDURES  Procedure(s) performed (including Critical Care):  Procedures   ____________________________________________   INITIAL IMPRESSION / ASSESSMENT AND PLAN / ED COURSE  I will have the patient drink plenty water.  I will have him follow-up with the HIV doctor.  We will try some doxycycline for any possible STD and Rocephin for the same reason.  It is possible it may do something for the patient's chronic sinusitis as well.  I have the patient follow-up with the rest of their doctors to continue working on the chronic sinusitis and mucous retention cyst that is present.  Patient will return for any further problems.              ____________________________________________   FINAL CLINICAL IMPRESSION(S) / ED DIAGNOSES  Final diagnoses:  Dysuria     ED Discharge Orders         Ordered    doxycycline (VIBRAMYCIN) 100 MG  capsule  2 times daily     10/14/18 0153           Note:  This document was prepared using Dragon voice recognition software and may include unintentional dictation errors.    Arnaldo Natal, MD 10/14/18 [phone]

## 2018-10-14 NOTE — Discharge Instructions (Addendum)
Take the doxycycline 1 twice a day.  This should help for any occult urinary infection and probably will help with the sinuses as well.  Please return for any further problems.  Please follow-up with your doctors.  Please let your HIV doctor know about your problems with the new HIV medicine.  Call her in the morning.

## 2018-12-25 ENCOUNTER — Emergency Department: Payer: Medicaid Other

## 2018-12-25 DIAGNOSIS — I10 Essential (primary) hypertension: Secondary | ICD-10-CM | POA: Diagnosis not present

## 2018-12-25 DIAGNOSIS — Z21 Asymptomatic human immunodeficiency virus [HIV] infection status: Secondary | ICD-10-CM | POA: Insufficient documentation

## 2018-12-25 DIAGNOSIS — R0789 Other chest pain: Secondary | ICD-10-CM | POA: Diagnosis not present

## 2018-12-25 DIAGNOSIS — R05 Cough: Secondary | ICD-10-CM | POA: Insufficient documentation

## 2018-12-25 DIAGNOSIS — Z79899 Other long term (current) drug therapy: Secondary | ICD-10-CM | POA: Insufficient documentation

## 2018-12-25 LAB — BASIC METABOLIC PANEL
Anion gap: 9 (ref 5–15)
BUN: 22 mg/dL — ABNORMAL HIGH (ref 6–20)
CO2: 22 mmol/L (ref 22–32)
Calcium: 9.1 mg/dL (ref 8.9–10.3)
Chloride: 106 mmol/L (ref 98–111)
Creatinine, Ser: 1.11 mg/dL (ref 0.61–1.24)
GFR calc Af Amer: >60 mL/min (ref >60)
GFR calc non Af Amer: >60 mL/min (ref >60)
Glucose, Bld: 113 mg/dL — ABNORMAL HIGH (ref 70–99)
Potassium: 4 mmol/L (ref 3.5–5.1)
Sodium: 137 mmol/L (ref 135–145)

## 2018-12-25 LAB — CBC
HCT: 41.4 % (ref 39.0–52.0)
Hemoglobin: 13.6 g/dL (ref 13.0–17.0)
MCH: 28.6 pg (ref 26.0–34.0)
MCHC: 32.9 g/dL (ref 30.0–36.0)
MCV: 87 fL (ref 80.0–100.0)
Platelets: 233 10*3/uL (ref 150–400)
RBC: 4.76 MIL/uL (ref 4.22–5.81)
RDW: 13.2 % (ref 11.5–15.5)
WBC: 5.4 10*3/uL (ref 4.0–10.5)
nRBC: 0 % (ref 0.0–0.2)

## 2018-12-25 LAB — TROPONIN I: Troponin I: <0.03 ng/mL (ref <0.03)

## 2018-12-25 MED ORDER — SODIUM CHLORIDE 0.9% FLUSH: 3.0000 mL | Freq: Once | INTRAVENOUS | Status: DC

## 2018-12-25 NOTE — ED Triage Notes (Signed)
Patient c/o medial chest pain radiating below left breast and going to back. Patient reports pain with deep inspiration.

## 2018-12-26 ENCOUNTER — Emergency Department
Admission: EM | Admit: 2018-12-26 | Discharge: 2018-12-26 | Disposition: A | Discharge status: Home / Self Care | Payer: Medicaid Other | Attending: Emergency Medicine | Admitting: Emergency Medicine

## 2018-12-26 DIAGNOSIS — R0789 Other chest pain: Secondary | ICD-10-CM

## 2018-12-26 MED ORDER — HYDROCOD POLST-CPM POLST ER 10-8 MG/5ML PO SUER
5.0000 mL | Freq: Once | ORAL | Status: AC
  Administered 2018-12-26: 5 mL via ORAL
  Filled 2018-12-26: qty 5

## 2018-12-26 MED ORDER — HYDROCODONE-HOMATROPINE 5-1.5 MG/5ML PO SYRP: 5.0000 mL | ORAL_SOLUTION | Freq: Four times a day (QID) | ORAL | 0 refills | Status: DC | PRN

## 2018-12-26 NOTE — Discharge Instructions (Addendum)
Your workup in the Emergency Department today was reassuring.  We did not find any specific abnormalities.  We recommend you drink plenty of fluids, take your regular medications and/or any new ones prescribed today, and follow up with the doctor(s) listed in these documents as recommended.  Return to the Emergency Department if you develop new or worsening symptoms that concern you.  

## 2018-12-26 NOTE — ED Provider Notes (Signed)
[facility] Emergency Department Provider Note  ____________________________________________   First MD Initiated Contact with Patient 12/26/18 [phone]     (approximate)  I have reviewed the triage vital signs and the nursing notes.   HISTORY  Chief Complaint Chest Pain    HPI Jonathan Deleon is a 48 y.o. adult who is transgender male to male and identifies as male with a history of frequent emergency department visits and medical history as listed below.  She presents today for evaluation of chest pain and cough.  These issues are chronic and she has been seen for them in the past multiple times.  She says that she has chronic sinus trouble and sees an ENT specialist at [facility].  He has been delaying a surgery because of coronavirus and then she saw him 2 days ago and he said that he was not going to operate because he was afraid he might "mess something up".   She is frustrated because she says that the drainage from her sinuses drains down into her chest and gives her chest infections.  Nothing particular makes her symptoms better or worse.  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  She denies fever/chills, nausea, vomiting, chest pain except for the left-sided chest wall aching that she has had in the past, and abdominal pain.          Past Medical History:  Diagnosis Date   HIV (human immunodeficiency virus infection) (HCC)    Hypertension    Seizures (HCC)    Stroke [facility])     Patient Active Problem List   Diagnosis Date Noted   Seizure (HCC) 03/18/2016    Past Surgical History:  Procedure Laterality Date   BRAIN SURGERY     COSMETIC SURGERY      Prior to Admission medications   Medication Sig Start Date End Date Taking? Authorizing Provider  aspirin 325 MG tablet Take 325 mg by mouth daily.    [provider]  doxycycline (VIBRAMYCIN) 100 MG capsule Take 1 capsule (100 mg total) by  mouth 2 (two) times daily. Patient not taking: Reported on 12/25/2018 10/14/18   Arnaldo Natal, MD  efavirenz-emtricitabine-tenofovir (ATRIPLA) 600-200-300 MG per tablet Take 1 tablet by mouth at bedtime.    [provider]  estradiol (ESTRACE) 1 MG tablet Take 4 mg by mouth daily.    [provider]  fluconazole (DIFLUCAN) 150 MG tablet Take one now and one in a week Patient not taking: Reported on 12/25/2018 08/11/18   Sherrie Mustache, Roselyn Bering, PA-C  HYDROcodone-homatropine [facility]) 5-1.5 MG/5ML syrup Take 5 mLs by mouth every 6 (six) hours as needed for cough. 12/26/18   Loleta Rose, MD  levETIRAcetam (KEPPRA) 500 MG tablet Take 1 tablet (500 mg total) by mouth 2 (two) times daily. 01/30/18 10/11/18  Willy Eddy, MD  spironolactone (ALDACTONE) 100 MG tablet Take 100 mg by mouth every morning. 09/14/14   [provider]    Allergies Shellfish allergy and Penicillins  No family history on file.  Social History Social History   Tobacco Use   Smoking status: Never Smoker   Smokeless tobacco: Former Engineer, water Use Topics   Alcohol use: Yes    Comment: occas.    Drug use: No    Review of Systems Constitutional: No fever/chills Eyes: No visual changes. ENT: Chronic sinus problems.  No sore throat. Cardiovascular: Left-sided chest wall pain. Respiratory: Denies shortness  of breath. Gastrointestinal: No abdominal pain.  No nausea, no vomiting.  No diarrhea.  No constipation. Genitourinary: Negative for dysuria. Musculoskeletal: Negative for neck pain.  Negative for back pain. Integumentary: Negative for rash. Neurological: Negative for headaches, focal weakness or numbness.   ____________________________________________   PHYSICAL EXAM:  VITAL SIGNS: ED Triage Vitals  Enc Vitals Group     BP 12/25/18 2123 113/72     Pulse Rate 12/25/18 2123 78     Resp 12/25/18 2123 18     Temp 12/25/18 2123 98.2 F (36.8 C)     Temp Source 12/25/18 2123  Oral     SpO2 12/25/18 2123 100 %     Weight 12/25/18 2120 74.8 kg (165 lb)     Height 12/25/18 2120 1.778 m (5\' 10" )     Head Circumference --      Peak Flow --      Pain Score 12/25/18 2120 10     Pain Loc --      Pain Edu? --      Excl. in GC? --     Constitutional: Alert and oriented. Well appearing and in no acute distress. Eyes: Conjunctivae are normal.  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 Head: Atraumatic. Ears:  Healthy appearing ear canals and TMs bilaterally Nose: No obvious congestion or rhinorrhea. Mouth/Throat: Mucous membranes are moist. Neck: No stridor.  No meningeal signs.   Cardiovascular: Normal rate, regular rhythm. Good peripheral circulation. Grossly normal heart sounds. Respiratory: Normal respiratory effort.  No retractions. No audible wheezing. Gastrointestinal: Soft and nontender. No distention.  Musculoskeletal: Some reproducible left-sided chest wall tenderness.  No lower extremity tenderness nor edema. No gross deformities of extremities. Neurologic:  Normal speech and language. No gross focal neurologic deficits are appreciated.  Skin:  Skin is warm, dry and intact. No rash noted. Psychiatric: Mood and affect are normal. Speech and behavior are normal.  ____________________________________________   LABS (all labs ordered are listed, but only abnormal results are displayed)  Labs Reviewed  BASIC METABOLIC PANEL - Abnormal; Notable for the following components:      Result Value   Glucose, Bld 113 (*)    BUN 22 (*)    All other components within normal limits  CBC  TROPONIN I   ____________________________________________  EKG  ED ECG REPORT I, Loleta Rose, the attending physician, personally viewed and interpreted this ECG.  Date: 12/25/2018 EKG Time: 21:24 Rate: 77 Rhythm: normal sinus rhythm QRS Axis: normal Intervals: normal ST/T Wave abnormalities: normal Narrative Interpretation: no evidence of acute  ischemia  ____________________________________________  RADIOLOGY I, Loleta Rose, personally viewed and evaluated these images (plain radiographs) as part of my medical decision making, as well as reviewing the written report by the radiologist.  ED MD interpretation:  No indication of acute abnormality  Official radiology report(s): Dg Chest 2 View  Result Date: 12/25/2018 CLINICAL DATA:  48 y/o M; medial chest pain radiating below the left breast and going to the back. EXAM: CHEST - 2 VIEW COMPARISON:  08/11/2018 chest radiograph. FINDINGS: Stable heart size and mediastinal contours are within normal limits. Both lungs are clear. The visualized skeletal structures are unremarkable. IMPRESSION: No active cardiopulmonary disease. Electronically Signed   By: Mitzi Hansen M.D.   On: 12/25/2018 22:36    ____________________________________________   PROCEDURES   Procedure(s) performed (including Critical Care):  Procedures   ____________________________________________   INITIAL IMPRESSION / MDM / ASSESSMENT AND PLAN / ED COURSE  As part  of my medical decision making, I reviewed the following data within the electronic MEDICAL RECORD NUMBER Nursing notes reviewed and incorporated, Labs reviewed,  EKG interpreted , Old chart reviewed, Radiograph reviewed , Notes from prior ED visits and [place] Controlled Substance Database      *Jonathan Diego A Crumm was evaluated in Emergency Department on 12/26/2018 for the symptoms described in the history of present illness. She was evaluated in the context of the global COVID-19 pandemic, which necessitated consideration that the patient might be at risk for infection with the SARS-CoV-2 virus that causes COVID-19. Institutional protocols and algorithms that pertain to the evaluation of patients at risk for COVID-19 are in a state of rapid change based on information released by regulatory bodies including the CDC and federal and state  organizations. These policies and algorithms were followed during the patient's care in the ED.  Some ED evaluations and interventions may be delayed as a result of limited staffing during the pandemic.*  Differential diagnosis includes, but is not limited to, musculoskeletal pain/strain, bronchitis, pneumonia, HIV complication, COVID-19.  The patient is in no distress, ambulating without difficulty, normal vital signs, normal lab work, nonischemic EKG, normal chest x-ray.  She is frustrated by being delayed for her ENT surgery and vented about her frustrations and ongoing symptoms for a period of time.  I reassured her that there is no evidence of anything acute or emergent tonight.  She wanted something to make her sinuses stop dripping but I explained that she is under the care of ENT and this is a chronic issue for a long time and I do not have anything to fix that.  She wanted pain medicine to make her chest up hurting by also pointed out that this is an recurrent issue and finally we agreed upon some cough syrup with hydrocodone.  I encourage close outpatient follow-up with her primary care doctor and I gave my usual customary return precautions.  She understands and agrees with the plan.      ____________________________________________  FINAL CLINICAL IMPRESSION(S) / ED DIAGNOSES  Final diagnoses:  Atypical chest pain     MEDICATIONS GIVEN DURING THIS VISIT:  Medications  sodium chloride flush (NS) 0.9 % injection 3 mL (has no administration in time range)  chlorpheniramine-HYDROcodone (TUSSIONEX) 10-8 MG/5ML suspension 5 mL (5 mLs Oral Given 12/26/18 0218)     ED Discharge Orders         Ordered    HYDROcodone-homatropine (HYCODAN) 5-1.5 MG/5ML syrup  Every 6 hours PRN     12/26/18 0217           Note:  This document was prepared using Dragon voice recognition software and may include unintentional dictation errors.   Loleta RoseForbach, Stefhanie Kachmar, MD 12/26/18 [id number]

## 2018-12-26 NOTE — ED Notes (Signed)
MD at bedside. 

## 2019-01-18 ENCOUNTER — Emergency Department
Admission: EM | Admit: 2019-01-18 | Discharge: 2019-01-18 | Disposition: A | Discharge status: Home / Self Care | Payer: Medicaid Other | Attending: Emergency Medicine | Admitting: Emergency Medicine

## 2019-01-18 ENCOUNTER — Other Ambulatory Visit: Payer: Self-pay

## 2019-01-18 ENCOUNTER — Encounter: Payer: Self-pay | Admitting: Emergency Medicine

## 2019-01-18 DIAGNOSIS — R51 Headache: Secondary | ICD-10-CM | POA: Diagnosis present

## 2019-01-18 DIAGNOSIS — Z7982 Long term (current) use of aspirin: Secondary | ICD-10-CM | POA: Insufficient documentation

## 2019-01-18 DIAGNOSIS — J0100 Acute maxillary sinusitis, unspecified: Secondary | ICD-10-CM | POA: Insufficient documentation

## 2019-01-18 DIAGNOSIS — Z21 Asymptomatic human immunodeficiency virus [HIV] infection status: Secondary | ICD-10-CM | POA: Insufficient documentation

## 2019-01-18 DIAGNOSIS — I10 Essential (primary) hypertension: Secondary | ICD-10-CM | POA: Insufficient documentation

## 2019-01-18 DIAGNOSIS — Z87891 Personal history of nicotine dependence: Secondary | ICD-10-CM | POA: Insufficient documentation

## 2019-01-18 MED ORDER — FLUORESCEIN SODIUM 1 MG OP STRP
1.0000 | ORAL_STRIP | Freq: Once | OPHTHALMIC | Status: DC
  Filled 2019-01-18: qty 1

## 2019-01-18 MED ORDER — DOXYCYCLINE HYCLATE 100 MG PO TABS
100.0000 mg | ORAL_TABLET | Freq: Once | ORAL | Status: AC
  Administered 2019-01-18: 100 mg via ORAL
  Filled 2019-01-18: qty 1

## 2019-01-18 MED ORDER — DOXYCYCLINE HYCLATE 50 MG PO CAPS: 100.0000 mg | ORAL_CAPSULE | Freq: Two times a day (BID) | ORAL | 0 refills | Status: AC

## 2019-01-18 MED ORDER — VISINE-AC 0.05-0.25 % OP SOLN: 2.0000 [drp] | Freq: Three times a day (TID) | OPHTHALMIC | 0 refills | Status: AC | PRN

## 2019-01-18 MED ORDER — LIDOCAINE VISCOUS HCL 2 % MT SOLN: 10.0000 mL | OROMUCOSAL | 0 refills | Status: DC | PRN

## 2019-01-18 MED ORDER — LIDOCAINE VISCOUS HCL 2 % MT SOLN
15.0000 mL | Freq: Once | OROMUCOSAL | Status: AC
  Administered 2019-01-18: 15 mL via OROMUCOSAL
  Filled 2019-01-18: qty 15

## 2019-01-18 MED ORDER — TRAMADOL HCL 50 MG PO TABS
50.0000 mg | ORAL_TABLET | Freq: Once | ORAL | Status: AC
  Administered 2019-01-18: 50 mg via ORAL
  Filled 2019-01-18: qty 1

## 2019-01-18 MED ORDER — TETRACAINE HCL 0.5 % OP SOLN
2.0000 [drp] | Freq: Once | OPHTHALMIC | Status: DC
  Filled 2019-01-18: qty 4
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
## 2019-01-18 NOTE — ED Triage Notes (Signed)
Sinus congestion x 1 months. Yellow drainage x 2 weeks. Denies fevers.

## 2019-02-23 ENCOUNTER — Other Ambulatory Visit: Payer: Self-pay

## 2019-02-23 ENCOUNTER — Emergency Department
Admission: EM | Admit: 2019-02-23 | Discharge: 2019-02-23 | Disposition: A | Discharge status: Home / Self Care | Payer: Medicaid Other | Attending: Emergency Medicine | Admitting: Emergency Medicine

## 2019-02-23 ENCOUNTER — Encounter: Payer: Self-pay | Admitting: Emergency Medicine

## 2019-02-23 DIAGNOSIS — I10 Essential (primary) hypertension: Secondary | ICD-10-CM | POA: Insufficient documentation

## 2019-02-23 DIAGNOSIS — Z7982 Long term (current) use of aspirin: Secondary | ICD-10-CM | POA: Diagnosis not present

## 2019-02-23 DIAGNOSIS — Z87891 Personal history of nicotine dependence: Secondary | ICD-10-CM | POA: Diagnosis not present

## 2019-02-23 DIAGNOSIS — R300 Dysuria: Secondary | ICD-10-CM | POA: Diagnosis present

## 2019-02-23 DIAGNOSIS — Z8673 Personal history of transient ischemic attack (TIA), and cerebral infarction without residual deficits: Secondary | ICD-10-CM | POA: Diagnosis not present

## 2019-02-23 DIAGNOSIS — Z21 Asymptomatic human immunodeficiency virus [HIV] infection status: Secondary | ICD-10-CM | POA: Insufficient documentation

## 2019-02-23 DIAGNOSIS — Z79899 Other long term (current) drug therapy: Secondary | ICD-10-CM | POA: Insufficient documentation

## 2019-02-23 LAB — URINALYSIS, COMPLETE (UACMP) WITH MICROSCOPIC
Bilirubin Urine: NEGATIVE
Glucose, UA: NEGATIVE mg/dL
Hgb urine dipstick: NEGATIVE
Ketones, ur: NEGATIVE mg/dL
Nitrite: NEGATIVE
Protein, ur: NEGATIVE mg/dL
Specific Gravity, Urine: 1.021 (ref 1.005–1.030)
pH: 5 (ref 5.0–8.0)

## 2019-02-23 LAB — CHLAMYDIA/NGC RT PCR (ARMC ONLY)
Chlamydia Tr: NOT DETECTED
N gonorrhoeae: NOT DETECTED

## 2019-02-23 MED ORDER — CEPHALEXIN 500 MG PO CAPS: 500.0000 mg | ORAL_CAPSULE | Freq: Two times a day (BID) | ORAL | 0 refills | Status: DC

## 2019-02-23 MED ORDER — CEPHALEXIN 500 MG PO CAPS
500.0000 mg | ORAL_CAPSULE | Freq: Once | ORAL | Status: AC
  Administered 2019-02-23: 500 mg via ORAL
  Filled 2019-02-23: qty 1

## 2019-02-23 MED ORDER — ACETAMINOPHEN 500 MG PO TABS
1000.0000 mg | ORAL_TABLET | Freq: Once | ORAL | Status: AC
  Administered 2019-02-23: 1000 mg via ORAL
  Filled 2019-02-23: qty 2

## 2019-02-23 MED ORDER — IBUPROFEN 600 MG PO TABS
600.0000 mg | ORAL_TABLET | Freq: Once | ORAL | Status: AC
  Administered 2019-02-23: 600 mg via ORAL
  Filled 2019-02-23: qty 1

## 2019-02-23 NOTE — ED Triage Notes (Signed)
Patient ambulatory to triage with steady gait, without difficulty or distress noted, mask in place; pt reports dysuria and "wants to be checked for STD"; denies discharge

## 2019-02-23 NOTE — Discharge Instructions (Addendum)
As we discussed, your urinalysis was clear today except for few trace bacteria, but since you are having burning when you urinate, we will treat you empirically with antibiotics.  Please take the full course of treatment unless otherwise advised by different physician.  Use over-the-counter ibuprofen and/or Tylenol as needed for pain relief.  Follow-up with your specialist at [facility] at the next available opportunity.  Return to the emergency department if you develop new or worsening symptoms that concern you.

## 2019-02-23 NOTE — ED Provider Notes (Signed)
[facility] Emergency Department Provider Note  ____________________________________________   First MD Initiated Contact with Patient 02/23/19 [phone]     (approximate)  I have reviewed the triage vital signs and the nursing notes.   HISTORY  Chief Complaint Dysuria    HPI Jonathan Diego A Dorian is a 48 y.o. adult with medical issues as listed below and who is well-known to the emergency department for relatively frequent visits, commonly for issues related to chronic sinusitis.  She presents tonight for evaluation  of burning when she urinates.  It is been going on a few days.  At first better might be an STD but then she realized she has not had sex in a long time.  She is having a little bit of pain in the suprapubic region as well.  Nothing particular makes symptoms better or worse except for the pain when urinating.  She denies fever, chest pain, shortness of breath, cough.  No other abdominal pain except as above.  No rectal pain.  Chronic nasal congestion and runny nose that goes down the back of her throat and leads to a sore throat, an issue for which I have seen her multiple times in the past.        Past Medical History:  Diagnosis Date  . HIV (human immunodeficiency virus infection) ([place])   . Hypertension   . Seizures ([place])   . Stroke [facility])     Patient Active Problem List   Diagnosis Date Noted  . Seizure ([place]) 03/18/2016    Past Surgical History:  Procedure Laterality Date  . BRAIN SURGERY    . COSMETIC SURGERY      Prior to Admission medications   Medication Sig Start Date End Date Taking? Authorizing Provider  aspirin 325 MG tablet Take 325 mg by mouth daily.    [provider]  cephALEXin (KEFLEX) 500 MG capsule Take 1 capsule (500 mg total) by mouth 2 (two) times daily. 02/23/19   Hinda Kehr, MD  efavirenz-emtricitabine-tenofovir (ATRIPLA) 600-200-300 MG per tablet Take 1 tablet by mouth at bedtime.    [provider]  estradiol (ESTRACE) 1 MG tablet Take 4 mg by mouth daily.    [provider]  fluconazole (DIFLUCAN) 150 MG tablet Take one now and one in a week Patient not taking: Reported on 12/25/2018 08/11/18   Caryn Section, Linden Dolin, PA-C  HYDROcodone-homatropine [facility]) 5-1.5 MG/5ML syrup Take 5 mLs by mouth every 6 (six) hours as needed for cough. 12/26/18   Hinda Kehr, MD  levETIRAcetam (KEPPRA) 500 MG tablet Take 1 tablet (500 mg total) by mouth 2 (two) times daily. 01/30/18 10/11/18  Merlyn Lot, MD  lidocaine (XYLOCAINE) 2 % solution Use as directed 10 mLs in the mouth or throat as needed. 01/18/19   Laban Emperor, PA-C  spironolactone (ALDACTONE) 100 MG tablet Take 100 mg by mouth every morning. 09/14/14   [provider]  tetrahydrozoline-zinc (VISINE-AC) 0.05-0.25 % ophthalmic solution Place 2 drops into the left eye 3 (three) times daily as needed. 01/18/19   Laban Emperor, PA-C    Allergies Shellfish allergy and Penicillins  No family history on file.  Social History Social History   Tobacco Use  . Smoking status: Never Smoker  . Smokeless tobacco: Former Network engineer Use Topics  . Alcohol use: Yes    Comment: occas.   . Drug use: No    Review of Systems Constitutional: No fever/chills Eyes: No visual changes. ENT: Chronic sore throat  and nasal congestion. Cardiovascular: Denies chest pain. Respiratory: Denies shortness of breath. Gastrointestinal: Mild suprapubic pain.  No nausea or vomiting. Genitourinary: +dysuria. Musculoskeletal: Negative for neck pain.  Negative for back pain. Integumentary: Negative for rash. Neurological: Negative for headaches, focal weakness or numbness.   ____________________________________________   PHYSICAL EXAM:  VITAL SIGNS: ED Triage Vitals  Enc Vitals Group     BP 02/23/19 0359 120/65     Pulse Rate 02/23/19 0359 90     Resp 02/23/19 0359 18     Temp 02/23/19 0359 98.2 F (36.8 C)     Temp Source 02/23/19  0359 Oral     SpO2 02/23/19 0359 100 %     Weight 02/23/19 0359 72.6 kg (160 lb)     Height 02/23/19 0359 1.753 m (5\' 9" )     Head Circumference --      Peak Flow --      Pain Score 02/23/19 0358 10     Pain Loc --      Pain Edu? --      Excl. in [place]? --     Constitutional: Alert and oriented. Well appearing and in no acute distress. Eyes: Conjunctivae are normal.  Chronic right-sided blindness. Head: Atraumatic. Nose: No obvious congestion/rhinnorhea. Neck: No stridor.  No meningeal signs.   Cardiovascular: Normal rate, regular rhythm. Good peripheral circulation. Grossly normal heart sounds. Respiratory: Normal respiratory effort.  No retractions. No audible wheezing. Gastrointestinal: Soft and nontender. No distention.  Musculoskeletal: No lower extremity tenderness nor edema. No gross deformities of extremities. Neurologic:  Normal speech and language. No gross focal neurologic deficits are appreciated.  Skin:  Skin is warm, dry and intact. No rash noted. Psychiatric: Mood and affect are at her baseline.  ____________________________________________   LABS (all labs ordered are listed, but only abnormal results are displayed)  Labs Reviewed  URINALYSIS, COMPLETE (UACMP) WITH MICROSCOPIC - Abnormal; Notable for the following components:      Result Value   Color, Urine YELLOW (*)    APPearance CLEAR (*)    Leukocytes,Ua TRACE (*)    Bacteria, UA RARE (*)    All other components within normal limits  CHLAMYDIA/NGC RT PCR ([facility] ONLY)  URINE CULTURE   ____________________________________________  EKG  None - EKG not ordered by ED physician ____________________________________________  RADIOLOGY   ED MD interpretation: No indication for imaging  Official radiology report(s): No results found.  ____________________________________________   PROCEDURES   Procedure(s) performed (including Critical Care):  Procedures    ____________________________________________   INITIAL IMPRESSION / MDM / ASSESSMENT AND PLAN / ED COURSE  As part of my medical decision making, I reviewed the following data within the electronic MEDICAL RECORD NUMBER Nursing notes reviewed and incorporated, Labs reviewed , Old chart reviewed, Notes from prior ED visits and [place] Controlled Substance Database   The patient is well-appearing and in no distress.  She is reporting dysuria although her urinalysis is reassuring.  There are a few trace leukocytes and bacteria, and given the report of the symptoms, I will treat empirically and I have also sent a urine culture.  Urine GC/chlamydia test were negative.  She is comfortable with the plan.  I gave her first dose of Keflex 500 mg by mouth in the ED as well as a prescription.  Ibuprofen 600 mg and Tylenol 1000 mg by mouth now for what she reports is the suprapubic pain.  No indication for imaging.  I gave my usual customary return precautions.  ____________________________________________  FINAL CLINICAL IMPRESSION(S) / ED DIAGNOSES  Final diagnoses:  Dysuria     MEDICATIONS GIVEN DURING THIS VISIT:  Medications  cephALEXin (KEFLEX) capsule 500 mg (has no administration in time range)  ibuprofen (ADVIL) tablet 600 mg (has no administration in time range)  acetaminophen (TYLENOL) tablet 1,000 mg (has no administration in time range)     ED Discharge Orders         Ordered    cephALEXin (KEFLEX) 500 MG capsule  2 times daily     02/23/19 16100628          *Please note:  Jonathan Deleon was evaluated in Emergency Department on 02/23/2019 for the symptoms described in the history of present illness. She was evaluated in the context of the global COVID-19 pandemic, which necessitated consideration that the patient might be at risk for infection with the SARS-CoV-2 virus that causes COVID-19. Institutional protocols and algorithms that pertain to the evaluation of patients at  risk for COVID-19 are in a state of rapid change based on information released by regulatory bodies including the CDC and federal and state organizations. These policies and algorithms were followed during the patient's care in the ED.  Some ED evaluations and interventions may be delayed as a result of limited staffing during the pandemic.*  Note:  This document was prepared using Dragon voice recognition software and may include unintentional dictation errors.   Loleta RoseForbach, Alexx Mcburney, MD 02/23/19 [phone]

## 2019-02-25 LAB — URINE CULTURE
Culture: >=100000 — AB
Special Requests: NORMAL

## 2019-02-26 NOTE — Progress Notes (Signed)
ED Antimicrobial Stewardship Positive Culture Follow Up   Jonathan Deleon is an 48 y.o. adult who presented to [facility] on 02/23/2019 with a chief complaint of  Chief Complaint  Patient presents with  . Dysuria    Recent Results (from the past 720 hour(s))  Chlamydia/NGC rt PCR ([facility] only)     Status: None   Collection Time: 02/23/19  4:20 AM  Result Value Ref Range Status   Specimen source GC/Chlam URINE, RANDOM  Final   Chlamydia Tr NOT DETECTED NOT DETECTED Final   N gonorrhoeae NOT DETECTED NOT DETECTED Final    Comment: (NOTE) This CT/NG assay has not been evaluated in patients with a history of  hysterectomy. Performed at [facility], [address], [place], [place] [address]   Urine Culture     Status: Abnormal   Collection Time: 02/23/19  4:20 AM   Specimen: Urine, Random  Result Value Ref Range Status   Specimen Description   Final    URINE, RANDOM Performed at [facility], [address] [place], [place] [address]    Special Requests   Final    Normal Performed at [facility] [place], [place] [place],  [address]    Culture (A)  Final    >=100,000 COLONIES/mL ESCHERICHIA COLI Confirmed Extended Spectrum Beta-Lactamase Producer (ESBL).  In bloodstream infections from ESBL organisms, carbapenems are preferred over piperacillin/tazobactam. They are shown to have a lower risk of mortality.    Report Status 02/25/2019 FINAL  Final   Organism ID, Bacteria ESCHERICHIA COLI (A)  Final      Susceptibility   Escherichia coli - MIC*    AMPICILLIN >=32 RESISTANT Resistant     CEFAZOLIN >=64 RESISTANT Resistant     CEFTRIAXONE >=64 RESISTANT Resistant     CIPROFLOXACIN >=4 RESISTANT Resistant     GENTAMICIN <=1 SENSITIVE Sensitive     IMIPENEM <=0.25 SENSITIVE Sensitive     NITROFURANTOIN <=16 SENSITIVE Sensitive     TRIMETH/SULFA >=320 RESISTANT Resistant     AMPICILLIN/SULBACTAM 8 SENSITIVE Sensitive     PIP/TAZO <=4  SENSITIVE Sensitive     Extended ESBL POSITIVE Resistant     * >=100,000 COLONIES/mL ESCHERICHIA COLI    [x]  Treated with Cephelexin, organism resistant to prescribed antimicrobial []  Patient discharged originally without antimicrobial agent and treatment is now indicated  New antibiotic prescription: Macrobid 100mg  BID times 5 days  ED Provider: Mallie Darting, PharmD, BCPS 02/26/2019 6:54 PM

## 2019-04-14 ENCOUNTER — Emergency Department: Payer: Medicaid Other

## 2019-04-14 ENCOUNTER — Other Ambulatory Visit: Payer: Self-pay

## 2019-04-14 ENCOUNTER — Encounter: Payer: Self-pay | Admitting: Emergency Medicine

## 2019-04-14 ENCOUNTER — Emergency Department
Admission: EM | Admit: 2019-04-14 | Discharge: 2019-04-14 | Disposition: A | Discharge status: Home / Self Care | Payer: Medicaid Other | Attending: Emergency Medicine | Admitting: Emergency Medicine

## 2019-04-14 DIAGNOSIS — Z21 Asymptomatic human immunodeficiency virus [HIV] infection status: Secondary | ICD-10-CM | POA: Insufficient documentation

## 2019-04-14 DIAGNOSIS — Z79899 Other long term (current) drug therapy: Secondary | ICD-10-CM | POA: Insufficient documentation

## 2019-04-14 DIAGNOSIS — Z7982 Long term (current) use of aspirin: Secondary | ICD-10-CM | POA: Diagnosis not present

## 2019-04-14 DIAGNOSIS — N12 Tubulo-interstitial nephritis, not specified as acute or chronic: Secondary | ICD-10-CM | POA: Diagnosis not present

## 2019-04-14 DIAGNOSIS — R1031 Right lower quadrant pain: Secondary | ICD-10-CM | POA: Diagnosis present

## 2019-04-14 DIAGNOSIS — I10 Essential (primary) hypertension: Secondary | ICD-10-CM | POA: Diagnosis not present

## 2019-04-14 LAB — CBC
HCT: 40 % (ref 39.0–52.0)
Hemoglobin: 13 g/dL (ref 13.0–17.0)
MCH: 28.3 pg (ref 26.0–34.0)
MCHC: 32.5 g/dL (ref 30.0–36.0)
MCV: 87.1 fL (ref 80.0–100.0)
Platelets: 311 10*3/uL (ref 150–400)
RBC: 4.59 MIL/uL (ref 4.22–5.81)
RDW: 12.8 % (ref 11.5–15.5)
WBC: 8.8 10*3/uL (ref 4.0–10.5)
nRBC: 0 % (ref 0.0–0.2)

## 2019-04-14 LAB — COMPREHENSIVE METABOLIC PANEL
ALT: 11 U/L (ref 0–44)
AST: 14 U/L — ABNORMAL LOW (ref 15–41)
Albumin: 3.8 g/dL (ref 3.5–5.0)
Alkaline Phosphatase: 93 U/L (ref 38–126)
Anion gap: 8 (ref 5–15)
BUN: 13 mg/dL (ref 6–20)
CO2: 25 mmol/L (ref 22–32)
Calcium: 8.7 mg/dL — ABNORMAL LOW (ref 8.9–10.3)
Chloride: 102 mmol/L (ref 98–111)
Creatinine, Ser: 1.02 mg/dL (ref 0.61–1.24)
GFR calc Af Amer: >60 mL/min (ref >60)
GFR calc non Af Amer: >60 mL/min (ref >60)
Glucose, Bld: 104 mg/dL — ABNORMAL HIGH (ref 70–99)
Potassium: 4.2 mmol/L (ref 3.5–5.1)
Sodium: 135 mmol/L (ref 135–145)
Total Bilirubin: 0.2 mg/dL — ABNORMAL LOW (ref 0.3–1.2)
Total Protein: 7.6 g/dL (ref 6.5–8.1)

## 2019-04-14 LAB — URINALYSIS, COMPLETE (UACMP) WITH MICROSCOPIC
Bacteria, UA: NONE SEEN
Bilirubin Urine: NEGATIVE
Glucose, UA: NEGATIVE mg/dL
Hgb urine dipstick: NEGATIVE
Ketones, ur: NEGATIVE mg/dL
Nitrite: NEGATIVE
Protein, ur: NEGATIVE mg/dL
Specific Gravity, Urine: 1.005 (ref 1.005–1.030)
pH: 6 (ref 5.0–8.0)

## 2019-04-14 LAB — LIPASE, BLOOD: Lipase: 24 U/L (ref 11–51)

## 2019-04-14 MED ORDER — OXYCODONE-ACETAMINOPHEN 5-325 MG PO TABS: 1.0000 | ORAL_TABLET | ORAL | 0 refills | Status: DC | PRN

## 2019-04-14 MED ORDER — KETOROLAC TROMETHAMINE 30 MG/ML IJ SOLN
15.0000 mg | Freq: Once | INTRAMUSCULAR | Status: AC
  Administered 2019-04-14: 22:00:00 15 mg via INTRAVENOUS
  Filled 2019-04-14: qty 1

## 2019-04-14 MED ORDER — ONDANSETRON HCL 4 MG/2ML IJ SOLN
4.0000 mg | Freq: Once | INTRAMUSCULAR | Status: AC
  Administered 2019-04-14: 21:00:00 4 mg via INTRAVENOUS
  Filled 2019-04-14: qty 2

## 2019-04-14 MED ORDER — PIPERACILLIN-TAZOBACTAM 3.375 G IVPB 30 MIN
3.3750 g | Freq: Once | INTRAVENOUS | Status: AC
  Administered 2019-04-14: 3.375 g via INTRAVENOUS
  Filled 2019-04-14: qty 50

## 2019-04-14 MED ORDER — NITROFURANTOIN MONOHYD MACRO 100 MG PO CAPS: 100.0000 mg | ORAL_CAPSULE | Freq: Two times a day (BID) | ORAL | 0 refills | Status: AC

## 2019-04-14 MED ORDER — SODIUM CHLORIDE 0.9 % IV BOLUS
1000.0000 mL | Freq: Once | INTRAVENOUS | Status: AC
  Administered 2019-04-14: 21:00:00 1000 mL via INTRAVENOUS

## 2019-04-14 MED ORDER — SODIUM CHLORIDE 0.9% FLUSH: 3.0000 mL | Freq: Once | INTRAVENOUS | Status: DC

## 2019-04-14 MED ORDER — IOHEXOL 300 MG/ML  SOLN
100.0000 mL | Freq: Once | INTRAMUSCULAR | Status: AC | PRN
  Administered 2019-04-14: 21:00:00 100 mL via INTRAVENOUS

## 2019-04-14 MED ORDER — MORPHINE SULFATE (PF) 4 MG/ML IV SOLN
4.0000 mg | Freq: Once | INTRAVENOUS | Status: AC
  Administered 2019-04-14: 21:00:00 4 mg via INTRAVENOUS
  Filled 2019-04-14: qty 1

## 2019-04-14 MED ORDER — NITROFURANTOIN MONOHYD MACRO 100 MG PO CAPS: 100.0000 mg | ORAL_CAPSULE | Freq: Two times a day (BID) | ORAL | 0 refills | Status: DC

## 2019-04-14 NOTE — ED Provider Notes (Signed)
[facility] Emergency Department Provider Note   ____________________________________________   First MD Initiated Contact with Patient 04/14/19 2013     (approximate)  I have reviewed the triage vital signs and the nursing notes.   HISTORY  Chief Complaint Abdominal Pain    HPI Jonathan Deleon is a 48 y.o. adult with past medical history of HIV, hypertension, and seizures who presents to the ED complaining of abdominal pain.  Patient reports she has been having intermittent pain in her abdomen over the past couple of weeks, but it became acutely worse over the past 24 hours.  She states it starts in her right lower quadrant and radiates over to the left and throughout the remainder of her abdomen.  This is been associated with nausea but no vomiting, she does endorse some constipation but denies any rectal pain.  She has not had any fevers and denies any dysuria or hematuria.  She took a laxative prior to arrival without relief.        Past Medical History:  Diagnosis Date  . HIV (human immunodeficiency virus infection) ([facility])   . Hypertension   . Seizures ([facility])   . Stroke [facility])     Patient Active Problem List   Diagnosis Date Noted  . Seizure ([facility]) 03/18/2016    Past Surgical History:  Procedure Laterality Date  . BRAIN SURGERY    . COSMETIC SURGERY      Prior to Admission medications   Medication Sig Start Date End Date Taking? Authorizing Provider  aspirin 325 MG tablet Take 325 mg by mouth daily.    [provider]  efavirenz-emtricitabine-tenofovir (ATRIPLA) 600-200-300 MG per tablet Take 1 tablet by mouth at bedtime.    [provider]  estradiol (ESTRACE) 1 MG tablet Take 4 mg by mouth daily.    [provider]  fluconazole (DIFLUCAN) 150 MG tablet Take one now and one in a week Patient not taking: Reported on 12/25/2018 08/11/18   Sherrie Mustache, Roselyn Bering, PA-C  HYDROcodone-homatropine [facility]) 5-1.5 MG/5ML syrup  Take 5 mLs by mouth every 6 (six) hours as needed for cough. 12/26/18   Loleta Rose, MD  levETIRAcetam (KEPPRA) 500 MG tablet Take 1 tablet (500 mg total) by mouth 2 (two) times daily. 01/30/18 10/11/18  Willy Eddy, MD  lidocaine (XYLOCAINE) 2 % solution Use as directed 10 mLs in the mouth or throat as needed. 01/18/19   Enid Derry, PA-C  nitrofurantoin, macrocrystal-monohydrate, (MACROBID) 100 MG capsule Take 1 capsule (100 mg total) by mouth 2 (two) times daily for 7 days. 04/14/19 04/21/19  Chesley Noon, MD  oxyCODONE-acetaminophen (PERCOCET) 5-325 MG tablet Take 1 tablet by mouth every 4 (four) hours as needed for severe pain. 04/14/19 04/13/20  Chesley Noon, MD  spironolactone (ALDACTONE) 100 MG tablet Take 100 mg by mouth every morning. 09/14/14   [provider]  tetrahydrozoline-zinc (VISINE-AC) 0.05-0.25 % ophthalmic solution Place 2 drops into the left eye 3 (three) times daily as needed. 01/18/19   Enid Derry, PA-C    Allergies Shellfish allergy and Penicillins  No family history on file.  Social History Social History   Tobacco Use  . Smoking status: Never Smoker  . Smokeless tobacco: Former Engineer, water Use Topics  . Alcohol use: Yes    Comment: occas.   . Drug use: No    Review of Systems  Constitutional: No fever/chills Eyes: No visual changes. ENT: No sore throat. Cardiovascular: Denies chest pain. Respiratory: Denies shortness  of breath. Gastrointestinal: Positive for abdominal pain and nausea, no vomiting.  No diarrhea.  Positive for constipation. Genitourinary: Negative for dysuria. Musculoskeletal: Negative for back pain. Skin: Negative for rash. Neurological: Negative for headaches, focal weakness or numbness.  ____________________________________________   PHYSICAL EXAM:  VITAL SIGNS: ED Triage Vitals  Enc Vitals Group     BP 04/14/19 1714 (!) 150/81     Pulse Rate 04/14/19 1714 90     Resp --      Temp 04/14/19 1714 99.6 F  (37.6 C)     Temp Source 04/14/19 1714 Oral     SpO2 04/14/19 1714 98 %     Weight 04/14/19 1715 165 lb (74.8 kg)     Height 04/14/19 1715 5\' 9"  (1.753 m)     Head Circumference --      Peak Flow --      Pain Score --      Pain Loc --      Pain Edu? --      Excl. in [place]? --     Constitutional: Alert and oriented. Eyes: Conjunctivae are normal. Head: Atraumatic. Nose: No congestion/rhinnorhea. Mouth/Throat: Mucous membranes are moist. Neck: Normal ROM Cardiovascular: Normal rate, regular rhythm. Grossly normal heart sounds. Respiratory: Normal respiratory effort.  No retractions. Lungs CTAB. Gastrointestinal: Soft and diffusely tender to palpation, voluntary guarding. No distention. Genitourinary: deferred Musculoskeletal: No lower extremity tenderness nor edema. Neurologic:  Normal speech and language. No gross focal neurologic deficits are appreciated. Skin:  Skin is warm, dry and intact. No rash noted. Psychiatric: Mood and affect are normal. Speech and behavior are normal.  ____________________________________________   LABS (all labs ordered are listed, but only abnormal results are displayed)  Labs Reviewed  COMPREHENSIVE METABOLIC PANEL - Abnormal; Notable for the following components:      Result Value   Glucose, Bld 104 (*)    Calcium 8.7 (*)    AST 14 (*)    Total Bilirubin 0.2 (*)    All other components within normal limits  URINALYSIS, COMPLETE (UACMP) WITH MICROSCOPIC - Abnormal; Notable for the following components:   Color, Urine STRAW (*)    APPearance CLEAR (*)    Leukocytes,Ua TRACE (*)    All other components within normal limits  URINE CULTURE  LIPASE, BLOOD  CBC     PROCEDURES  Procedure(s) performed (including Critical Care):  Procedures   ____________________________________________   INITIAL IMPRESSION / ASSESSMENT AND PLAN / ED COURSE       48 year old male presenting with intermittent abdominal pain over the past couple of  weeks, acutely worse over the past 24 hours and associated with constipation.  Patient has significant tenderness on exam, repeat no recent CT of abdomen during recent bouts of pain, will check CT abdomen.  Labs are thus far unremarkable, LFTs and lipase within normal limits.  UA borderline for infection, will send culture.  CT shows inflammatory changes in the area of patient's right ureter, no evidence of nephrolithiasis or hydronephrosis.  Infection much more likely than malignancy, however instructed patient to follow-up with PCP for repeat imaging.  Antibiotic options are limited as patient's culture from approximately 2 months ago grew ESBL E. coli.  She has an allergy to penicillins, but states this allergy is mild and will give dose of IV Zosyn here in the ED.  It appears the only option for oral antibiotics would be Macrobid in her case.  Will prescribe this and counseled patient to follow-up closely with PCP  and return to the ED for new or worsening symptoms.  Counseled patient on the possibility of antibiotic failure and need for admission for IV antibiotics.  Patient expresses understanding and agrees with plan.      ____________________________________________   FINAL CLINICAL IMPRESSION(S) / ED DIAGNOSES  Final diagnoses:  Right lower quadrant abdominal pain  Pyelonephritis     ED Discharge Orders         Ordered    nitrofurantoin, macrocrystal-monohydrate, (MACROBID) 100 MG capsule  2 times daily,   Status:  Discontinued     04/14/19 2141    oxyCODONE-acetaminophen (PERCOCET) 5-325 MG tablet  Every 4 hours PRN,   Status:  Discontinued     04/14/19 2141    nitrofurantoin, macrocrystal-monohydrate, (MACROBID) 100 MG capsule  2 times daily     04/14/19 2151    oxyCODONE-acetaminophen (PERCOCET) 5-325 MG tablet  Every 4 hours PRN     04/14/19 2152           Note:  This document was prepared using Dragon voice recognition software and may include unintentional dictation  errors.   Chesley NoonJessup, Verona Hartshorn, MD 04/14/19 [phone]

## 2019-04-14 NOTE — ED Notes (Signed)
Pt speaking with this RN in NAD, A&Ox4. PT c/o pain piercing sharp pain in the right side that radiates to the whole abdomen area.

## 2019-04-14 NOTE — ED Notes (Signed)
Dr. Charna Archer informed of continued abdominal pain despite receiving pain medications, verbal given for 15mg  toradol IV

## 2019-04-14 NOTE — ED Triage Notes (Signed)
Pt to ER with c/o RLQ abdominal pain that radiates into the left.  States also having intermittent back pain.  States nausea, denies vomiting.  States pain is like a spasm.

## 2019-04-14 NOTE — ED Notes (Signed)
Pt sitting in bed talking loudly on cell phone, reports pain 10/10 in abdomen and right side

## 2019-04-17 LAB — URINE CULTURE: Culture: 50000 — AB

## 2019-04-25 ENCOUNTER — Emergency Department
Admission: EM | Admit: 2019-04-25 | Discharge: 2019-04-25 | Disposition: A | Discharge status: Home / Self Care | Payer: Medicaid Other | Attending: Emergency Medicine | Admitting: Emergency Medicine

## 2019-04-25 ENCOUNTER — Emergency Department: Payer: Medicaid Other

## 2019-04-25 ENCOUNTER — Other Ambulatory Visit: Payer: Self-pay

## 2019-04-25 ENCOUNTER — Encounter: Payer: Self-pay | Admitting: Emergency Medicine

## 2019-04-25 DIAGNOSIS — Z7982 Long term (current) use of aspirin: Secondary | ICD-10-CM | POA: Diagnosis not present

## 2019-04-25 DIAGNOSIS — Z79899 Other long term (current) drug therapy: Secondary | ICD-10-CM | POA: Insufficient documentation

## 2019-04-25 DIAGNOSIS — N50811 Right testicular pain: Secondary | ICD-10-CM | POA: Insufficient documentation

## 2019-04-25 DIAGNOSIS — R1031 Right lower quadrant pain: Secondary | ICD-10-CM | POA: Diagnosis present

## 2019-04-25 DIAGNOSIS — K6289 Other specified diseases of anus and rectum: Secondary | ICD-10-CM | POA: Insufficient documentation

## 2019-04-25 DIAGNOSIS — I10 Essential (primary) hypertension: Secondary | ICD-10-CM | POA: Insufficient documentation

## 2019-04-25 DIAGNOSIS — N50819 Testicular pain, unspecified: Secondary | ICD-10-CM

## 2019-04-25 LAB — COMPREHENSIVE METABOLIC PANEL
ALT: 14 U/L (ref 0–44)
AST: 18 U/L (ref 15–41)
Albumin: 3.7 g/dL (ref 3.5–5.0)
Alkaline Phosphatase: 84 U/L (ref 38–126)
Anion gap: 9 (ref 5–15)
BUN: 15 mg/dL (ref 6–20)
CO2: 23 mmol/L (ref 22–32)
Calcium: 9.1 mg/dL (ref 8.9–10.3)
Chloride: 103 mmol/L (ref 98–111)
Creatinine, Ser: 1.02 mg/dL (ref 0.61–1.24)
GFR calc Af Amer: >60 mL/min (ref >60)
GFR calc non Af Amer: >60 mL/min (ref >60)
Glucose, Bld: 99 mg/dL (ref 70–99)
Potassium: 4 mmol/L (ref 3.5–5.1)
Sodium: 135 mmol/L (ref 135–145)
Total Bilirubin: 0.3 mg/dL (ref 0.3–1.2)
Total Protein: 7.4 g/dL (ref 6.5–8.1)

## 2019-04-25 LAB — CBC WITH DIFFERENTIAL/PLATELET
Abs Immature Granulocytes: 0.02 10*3/uL (ref 0.00–0.07)
Basophils Absolute: 0.1 10*3/uL (ref 0.0–0.1)
Basophils Relative: 1 %
Eosinophils Absolute: 0.1 10*3/uL (ref 0.0–0.5)
Eosinophils Relative: 2 %
HCT: 40.6 % (ref 39.0–52.0)
Hemoglobin: 13.2 g/dL (ref 13.0–17.0)
Immature Granulocytes: 0 %
Lymphocytes Relative: 47 %
Lymphs Abs: 3 10*3/uL (ref 0.7–4.0)
MCH: 28 pg (ref 26.0–34.0)
MCHC: 32.5 g/dL (ref 30.0–36.0)
MCV: 86 fL (ref 80.0–100.0)
Monocytes Absolute: 0.7 10*3/uL (ref 0.1–1.0)
Monocytes Relative: 11 %
Neutro Abs: 2.5 10*3/uL (ref 1.7–7.7)
Neutrophils Relative %: 39 %
Platelets: 326 10*3/uL (ref 150–400)
RBC: 4.72 MIL/uL (ref 4.22–5.81)
RDW: 12.6 % (ref 11.5–15.5)
WBC: 6.3 10*3/uL (ref 4.0–10.5)
nRBC: 0 % (ref 0.0–0.2)

## 2019-04-25 LAB — URINALYSIS, COMPLETE (UACMP) WITH MICROSCOPIC
Bilirubin Urine: NEGATIVE
Glucose, UA: NEGATIVE mg/dL
Hgb urine dipstick: NEGATIVE
Ketones, ur: NEGATIVE mg/dL
Leukocytes,Ua: NEGATIVE
Nitrite: NEGATIVE
Protein, ur: NEGATIVE mg/dL
Specific Gravity, Urine: 1.01 (ref 1.005–1.030)
pH: 6 (ref 5.0–8.0)

## 2019-04-25 LAB — LIPASE, BLOOD: Lipase: 21 U/L (ref 11–51)

## 2019-04-25 MED ORDER — TRAMADOL HCL 50 MG PO TABS: 50.0000 mg | ORAL_TABLET | Freq: Four times a day (QID) | ORAL | 0 refills | Status: DC | PRN

## 2019-04-25 MED ORDER — METRONIDAZOLE 500 MG PO TABS: 500.0000 mg | ORAL_TABLET | Freq: Two times a day (BID) | ORAL | 0 refills | Status: DC

## 2019-04-25 MED ORDER — MORPHINE SULFATE (PF) 4 MG/ML IV SOLN
4.0000 mg | Freq: Once | INTRAVENOUS | Status: AC
  Administered 2019-04-25: 4 mg via INTRAVENOUS
  Filled 2019-04-25: qty 1

## 2019-04-25 MED ORDER — ONDANSETRON HCL 4 MG/2ML IJ SOLN
4.0000 mg | Freq: Once | INTRAMUSCULAR | Status: AC
  Administered 2019-04-25: 10:00:00 4 mg via INTRAVENOUS
  Filled 2019-04-25: qty 2

## 2019-04-25 MED ORDER — IOHEXOL 300 MG/ML  SOLN
100.0000 mL | Freq: Once | INTRAMUSCULAR | Status: AC | PRN
  Administered 2019-04-25: 100 mL via INTRAVENOUS

## 2019-04-25 MED ORDER — CIPROFLOXACIN HCL 500 MG PO TABS: 500.0000 mg | ORAL_TABLET | Freq: Two times a day (BID) | ORAL | 0 refills | Status: DC

## 2019-04-25 NOTE — ED Triage Notes (Signed)
Pt to ED via POV c/o RLQ abd pain and right lower back pain. Pt was recently treated for UTI but does not feel like the medication has worked. Pt is in NAD

## 2019-04-25 NOTE — ED Provider Notes (Signed)
[facility] Emergency Department Provider Note   ____________________________________________    I have reviewed the triage vital signs and the nursing notes.   HISTORY  Chief Complaint Abdominal Pain     HPI Jonathan Deleon is a 48 y.o. adult with a history of HIV, hypertension who presents with complaints of right scrotal pain and right abdominal pain.  Patient reports recently treated for urinary tract infection with Macrobid.  She reports this is not significantly improved and in fact her abdominal pain has worsened and now she has testicular pain.  Denies fevers or chills.  Some dysuria still.  No vomiting.  Normal stools  Past Medical History:  Diagnosis Date  . HIV (human immunodeficiency virus infection) (HCC)   . Hypertension   . Seizures (HCC)   . Stroke [facility](HCC)     Patient Active Problem List   Diagnosis Date Noted  . Seizure (HCC) 03/18/2016    Past Surgical History:  Procedure Laterality Date  . BRAIN SURGERY    . COSMETIC SURGERY      Prior to Admission medications   Medication Sig Start Date End Date Taking? Authorizing Provider  aspirin 325 MG tablet Take 325 mg by mouth daily.    [provider]  ciprofloxacin (CIPRO) 500 MG tablet Take 1 tablet (500 mg total) by mouth 2 (two) times daily. 04/25/19   Jene EveryKinner, Sakai Heinle, MD  efavirenz-emtricitabine-tenofovir (ATRIPLA) 600-200-300 MG per tablet Take 1 tablet by mouth at bedtime.    [provider]  estradiol (ESTRACE) 1 MG tablet Take 4 mg by mouth daily.    [provider]  fluconazole (DIFLUCAN) 150 MG tablet Take one now and one in a week Patient not taking: Reported on 12/25/2018 08/11/18   Sherrie MustacheFisher, Roselyn BeringSusan W, PA-C  HYDROcodone-homatropine [facility](HYCODAN) 5-1.5 MG/5ML syrup Take 5 mLs by mouth every 6 (six) hours as needed for cough. 12/26/18   Loleta RoseForbach, Cory, MD  levETIRAcetam (KEPPRA) 500 MG tablet Take 1 tablet (500 mg total) by mouth 2 (two) times daily.  01/30/18 10/11/18  Willy Eddyobinson, Patrick, MD  lidocaine (XYLOCAINE) 2 % solution Use as directed 10 mLs in the mouth or throat as needed. 01/18/19   Enid DerryWagner, Ashley, PA-C  metroNIDAZOLE (FLAGYL) 500 MG tablet Take 1 tablet (500 mg total) by mouth 2 (two) times daily after a meal. 04/25/19   Jene EveryKinner, Tyrell Seifer, MD  oxyCODONE-acetaminophen (PERCOCET) 5-325 MG tablet Take 1 tablet by mouth every 4 (four) hours as needed for severe pain. 04/14/19 04/13/20  Chesley NoonJessup, Charles, MD  spironolactone (ALDACTONE) 100 MG tablet Take 100 mg by mouth every morning. 09/14/14   [provider]  tetrahydrozoline-zinc (VISINE-AC) 0.05-0.25 % ophthalmic solution Place 2 drops into the left eye 3 (three) times daily as needed. 01/18/19   Enid DerryWagner, Ashley, PA-C  traMADol (ULTRAM) 50 MG tablet Take 1 tablet (50 mg total) by mouth every 6 (six) hours as needed. 04/25/19 04/24/20  Jene EveryKinner, Delyle Weider, MD     Allergies Shellfish allergy and Penicillins  No family history on file.  Social History Social History   Tobacco Use  . Smoking status: Never Smoker  . Smokeless tobacco: Former Engineer, waterUser  Substance Use Topics  . Alcohol use: Yes    Comment: occas.   . Drug use: No    Review of Systems  Constitutional: No fever/chills Eyes: No visual changes.  ENT: No sore throat. Cardiovascular: Denies chest pain. Respiratory: Denies shortness of breath. Gastrointestinal: As above Genitourinary: As above Musculoskeletal: Negative for back  pain. Skin: Negative for rash. Neurological: Negative for headaches or weakness   ____________________________________________   PHYSICAL EXAM:  VITAL SIGNS: ED Triage Vitals  Enc Vitals Group     BP 04/25/19 0904 100/67     Pulse Rate 04/25/19 0904 83     Resp 04/25/19 0904 16     Temp 04/25/19 0904 (!) 97.4 F (36.3 C)     Temp Source 04/25/19 0904 Oral     SpO2 04/25/19 0904 98 %     Weight 04/25/19 0900 72.6 kg (160 lb)     Height 04/25/19 0900 1.753 m (5\' 9" )     Head Circumference  --      Peak Flow --      Pain Score 04/25/19 0900 10     Pain Loc --      Pain Edu? --      Excl. in [place]? --     Constitutional: Alert and oriented.  Eyes: Conjunctivae are normal.  Head: Atraumatic. Nose: No congestion/rhinnorhea.  Cardiovascular: Normal rate, regular rhythm. Grossly normal heart sounds.  Good peripheral circulation. Respiratory: Normal respiratory effort.  No retractions. Lungs CTAB. Gastrointestinal: Right lower quadrant tenderness, no distention, no CVA tenderness Genitourinary: Right scrotal tenderness although no significant swelling Musculoskeletal: No lower extremity tenderness nor edema.  Warm and well perfused Neurologic: No gross focal neurologic deficits are appreciated.  Skin:  Skin is warm, dry and intact. No rash noted. Psychiatric: Mood and affect are normal. Speech and behavior are normal.  ____________________________________________   LABS (all labs ordered are listed, but only abnormal results are displayed)  Labs Reviewed  URINALYSIS, COMPLETE (UACMP) WITH MICROSCOPIC - Abnormal; Notable for the following components:      Result Value   Color, Urine STRAW (*)    APPearance CLEAR (*)    Bacteria, UA RARE (*)    All other components within normal limits  URINE CULTURE  CBC WITH DIFFERENTIAL/PLATELET  COMPREHENSIVE METABOLIC PANEL  LIPASE, BLOOD   ____________________________________________  EKG  None ____________________________________________  RADIOLOGY  CT abdomen pelvis overall reassuring, findings consistent with proctitis ____________________________________________   PROCEDURES  Procedure(s) performed: No  Procedures   Critical Care performed: No ____________________________________________   INITIAL IMPRESSION / ASSESSMENT AND PLAN / ED COURSE  Pertinent labs & imaging results that were available during my care of the patient were reviewed by me and considered in my medical decision making (see chart for  details).  Patient presents with right lower quadrant and right scrotal pain as above, will obtain ultrasound of the scrotum, pending labs, urinalysis, will give IV morphine, IV Zofran.  Patient feeling better after treatment, CT findings consistent with likely proctitis, will cover with antibiotics have the patient follow-up with her GI doctor, return precautions discussed    ____________________________________________   FINAL CLINICAL IMPRESSION(S) / ED DIAGNOSES  Final diagnoses:  Testicular pain  Proctitis        Note:  This document was prepared using Dragon voice recognition software and may include unintentional dictation errors.   Lavonia Drafts, MD 04/25/19 [id number]

## 2019-04-27 LAB — URINE CULTURE: Culture: >=100000 — AB

## 2019-06-15 ENCOUNTER — Emergency Department
Admission: EM | Admit: 2019-06-15 | Discharge: 2019-06-15 | Disposition: A | Discharge status: Home / Self Care | Payer: Medicaid Other | Attending: Student | Admitting: Student

## 2019-06-15 ENCOUNTER — Other Ambulatory Visit: Payer: Self-pay

## 2019-06-15 DIAGNOSIS — S199XXA Unspecified injury of neck, initial encounter: Secondary | ICD-10-CM | POA: Diagnosis present

## 2019-06-15 DIAGNOSIS — Y999 Unspecified external cause status: Secondary | ICD-10-CM | POA: Diagnosis not present

## 2019-06-15 DIAGNOSIS — Y92410 Unspecified street and highway as the place of occurrence of the external cause: Secondary | ICD-10-CM | POA: Insufficient documentation

## 2019-06-15 DIAGNOSIS — Z21 Asymptomatic human immunodeficiency virus [HIV] infection status: Secondary | ICD-10-CM | POA: Insufficient documentation

## 2019-06-15 DIAGNOSIS — I10 Essential (primary) hypertension: Secondary | ICD-10-CM | POA: Insufficient documentation

## 2019-06-15 DIAGNOSIS — Z7982 Long term (current) use of aspirin: Secondary | ICD-10-CM | POA: Insufficient documentation

## 2019-06-15 DIAGNOSIS — S161XXA Strain of muscle, fascia and tendon at neck level, initial encounter: Secondary | ICD-10-CM | POA: Diagnosis not present

## 2019-06-15 DIAGNOSIS — S40021A Contusion of right upper arm, initial encounter: Secondary | ICD-10-CM | POA: Insufficient documentation

## 2019-06-15 DIAGNOSIS — Y93I9 Activity, other involving external motion: Secondary | ICD-10-CM | POA: Diagnosis not present

## 2019-06-15 DIAGNOSIS — Z79899 Other long term (current) drug therapy: Secondary | ICD-10-CM | POA: Diagnosis not present

## 2019-06-15 MED ORDER — METHOCARBAMOL 500 MG PO TABS
1000.0000 mg | ORAL_TABLET | Freq: Once | ORAL | Status: AC
  Administered 2019-06-15: 1000 mg via ORAL
  Filled 2019-06-15: qty 2

## 2019-06-15 MED ORDER — MELOXICAM 15 MG PO TABS: 15.0000 mg | ORAL_TABLET | Freq: Every day | ORAL | 0 refills | Status: DC

## 2019-06-15 MED ORDER — METHOCARBAMOL 500 MG PO TABS: 500.0000 mg | ORAL_TABLET | Freq: Four times a day (QID) | ORAL | 0 refills | Status: DC

## 2019-06-15 MED ORDER — HYDROCODONE-ACETAMINOPHEN 5-325 MG PO TABS: 1.0000 | ORAL_TABLET | ORAL | 0 refills | Status: DC | PRN

## 2019-06-15 MED ORDER — MELOXICAM 7.5 MG PO TABS
15.0000 mg | ORAL_TABLET | Freq: Once | ORAL | Status: AC
  Administered 2019-06-15: 15 mg via ORAL
  Filled 2019-06-15: qty 2

## 2019-06-15 MED ORDER — OXYCODONE-ACETAMINOPHEN 5-325 MG PO TABS
1.0000 | ORAL_TABLET | Freq: Once | ORAL | Status: AC
  Administered 2019-06-15: 21:00:00 1 via ORAL
  Filled 2019-06-15: qty 1
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
## 2019-06-30 ENCOUNTER — Emergency Department: Payer: Medicaid Other

## 2019-06-30 ENCOUNTER — Encounter: Payer: Self-pay | Admitting: Emergency Medicine

## 2019-06-30 ENCOUNTER — Other Ambulatory Visit: Payer: Self-pay

## 2019-06-30 ENCOUNTER — Emergency Department
Admission: EM | Admit: 2019-06-30 | Discharge: 2019-06-30 | Disposition: A | Discharge status: Home / Self Care | Payer: Medicaid Other | Attending: Emergency Medicine | Admitting: Emergency Medicine

## 2019-06-30 DIAGNOSIS — Z8673 Personal history of transient ischemic attack (TIA), and cerebral infarction without residual deficits: Secondary | ICD-10-CM | POA: Insufficient documentation

## 2019-06-30 DIAGNOSIS — Y998 Other external cause status: Secondary | ICD-10-CM | POA: Diagnosis not present

## 2019-06-30 DIAGNOSIS — S52691A Other fracture of lower end of right ulna, initial encounter for closed fracture: Secondary | ICD-10-CM | POA: Insufficient documentation

## 2019-06-30 DIAGNOSIS — B20 Human immunodeficiency virus [HIV] disease: Secondary | ICD-10-CM | POA: Insufficient documentation

## 2019-06-30 DIAGNOSIS — I10 Essential (primary) hypertension: Secondary | ICD-10-CM | POA: Insufficient documentation

## 2019-06-30 DIAGNOSIS — Z87891 Personal history of nicotine dependence: Secondary | ICD-10-CM | POA: Insufficient documentation

## 2019-06-30 DIAGNOSIS — Y92410 Unspecified street and highway as the place of occurrence of the external cause: Secondary | ICD-10-CM | POA: Diagnosis not present

## 2019-06-30 DIAGNOSIS — S6991XA Unspecified injury of right wrist, hand and finger(s), initial encounter: Secondary | ICD-10-CM | POA: Diagnosis present

## 2019-06-30 DIAGNOSIS — Z7982 Long term (current) use of aspirin: Secondary | ICD-10-CM | POA: Insufficient documentation

## 2019-06-30 DIAGNOSIS — Y9389 Activity, other specified: Secondary | ICD-10-CM | POA: Insufficient documentation

## 2019-06-30 MED ORDER — OXYCODONE-ACETAMINOPHEN 5-325 MG PO TABS: 1.0000 | ORAL_TABLET | Freq: Four times a day (QID) | ORAL | 0 refills | Status: DC | PRN

## 2019-06-30 MED ORDER — OXYCODONE-ACETAMINOPHEN 5-325 MG PO TABS
1.0000 | ORAL_TABLET | Freq: Once | ORAL | Status: AC
  Administered 2019-06-30: 1 via ORAL
  Filled 2019-06-30: qty 1
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
## 2019-07-15 ENCOUNTER — Emergency Department: Payer: Medicaid Other

## 2019-07-15 ENCOUNTER — Emergency Department
Admission: EM | Admit: 2019-07-15 | Discharge: 2019-07-15 | Disposition: A | Discharge status: Home / Self Care | Payer: Medicaid Other | Attending: Emergency Medicine | Admitting: Emergency Medicine

## 2019-07-15 ENCOUNTER — Other Ambulatory Visit: Payer: Self-pay

## 2019-07-15 DIAGNOSIS — Z7982 Long term (current) use of aspirin: Secondary | ICD-10-CM | POA: Diagnosis not present

## 2019-07-15 DIAGNOSIS — M79631 Pain in right forearm: Secondary | ICD-10-CM | POA: Insufficient documentation

## 2019-07-15 DIAGNOSIS — M79601 Pain in right arm: Secondary | ICD-10-CM

## 2019-07-15 DIAGNOSIS — Z79899 Other long term (current) drug therapy: Secondary | ICD-10-CM | POA: Insufficient documentation

## 2019-07-15 DIAGNOSIS — I10 Essential (primary) hypertension: Secondary | ICD-10-CM | POA: Diagnosis not present

## 2019-07-15 DIAGNOSIS — Z21 Asymptomatic human immunodeficiency virus [HIV] infection status: Secondary | ICD-10-CM | POA: Insufficient documentation

## 2019-07-15 MED ORDER — OXYCODONE-ACETAMINOPHEN 5-325 MG PO TABS
1.0000 | ORAL_TABLET | Freq: Once | ORAL | Status: AC
  Administered 2019-07-15: 1 via ORAL
  Filled 2019-07-15: qty 1

## 2019-07-15 MED ORDER — OXYCODONE-ACETAMINOPHEN 5-325 MG PO TABS: 1.0000 | ORAL_TABLET | Freq: Three times a day (TID) | ORAL | 0 refills | Status: AC | PRN

## 2019-07-15 NOTE — ED Triage Notes (Signed)
Pt reports having a seizure today and falling on right arm.  Pt reports right arm is already broken and pt is wearing a splint.  Pt reports pain is worse in right arm.  Pt alert  Speech clear

## 2019-07-15 NOTE — ED Provider Notes (Signed)
Emergency Department Provider Note  ____________________________________________  Time seen: Approximately 9:10 PM  I have reviewed the triage vital signs and the nursing notes.   HISTORY  Chief Complaint Arm Injury and Seizures   Historian Patient     HPI Jonathan Deleon is a 48 y.o. adult with a history of HIV, hypertension and seizures, presents to the emergency department with worsening right forearm pain.  Patient states that he recently sustained a fracture and had a seizure today and has acute on chronic pain.  Patient states that he does not wish to be evaluated for his seizures.  No numbness or tingling in the bilateral upper extremities.  No subjective weakness.  Patient only wants to be evaluated for worsening fracture.   Past Medical History:  Diagnosis Date  . HIV (human immunodeficiency virus infection) (HCC)   . Hypertension   . Seizures (HCC)   . Stroke [facility](HCC)      Immunizations up to date:  Yes.     Past Medical History:  Diagnosis Date  . HIV (human immunodeficiency virus infection) (HCC)   . Hypertension   . Seizures (HCC)   . Stroke [facility](HCC)     Patient Active Problem List   Diagnosis Date Noted  . Seizure (HCC) 03/18/2016    Past Surgical History:  Procedure Laterality Date  . BRAIN SURGERY    . COSMETIC SURGERY      Prior to Admission medications   Medication Sig Start Date End Date Taking? Authorizing Provider  aspirin 325 MG tablet Take 325 mg by mouth daily.    [provider]  ciprofloxacin (CIPRO) 500 MG tablet Take 1 tablet (500 mg total) by mouth 2 (two) times daily. 04/25/19   Jene EveryKinner, Robert, MD  efavirenz-emtricitabine-tenofovir (ATRIPLA) 600-200-300 MG per tablet Take 1 tablet by mouth at bedtime.    [provider]  estradiol (ESTRACE) 1 MG tablet Take 4 mg by mouth daily.    [provider]  fluconazole (DIFLUCAN) 150 MG tablet Take one now and one in a week Patient not taking: Reported on  12/25/2018 08/11/18   Faythe GheeFisher, Susan W, PA-C  HYDROcodone-acetaminophen (NORCO/VICODIN) 5-325 MG tablet Take 1 tablet by mouth every 4 (four) hours as needed for moderate pain. 06/15/19   Cuthriell, Delorise RoyalsJonathan D, PA-C  HYDROcodone-homatropine (HYCODAN) 5-1.5 MG/5ML syrup Take 5 mLs by mouth every 6 (six) hours as needed for cough. 12/26/18   Loleta RoseForbach, Cory, MD  levETIRAcetam (KEPPRA) 500 MG tablet Take 1 tablet (500 mg total) by mouth 2 (two) times daily. 01/30/18 10/11/18  Willy Eddyobinson, Patrick, MD  lidocaine (XYLOCAINE) 2 % solution Use as directed 10 mLs in the mouth or throat as needed. 01/18/19   Enid DerryWagner, Ashley, PA-C  meloxicam (MOBIC) 15 MG tablet Take 1 tablet (15 mg total) by mouth daily. 06/15/19   Cuthriell, Delorise RoyalsJonathan D, PA-C  methocarbamol (ROBAXIN) 500 MG tablet Take 1 tablet (500 mg total) by mouth 4 (four) times daily. 06/15/19   Cuthriell, Delorise RoyalsJonathan D, PA-C  metroNIDAZOLE (FLAGYL) 500 MG tablet Take 1 tablet (500 mg total) by mouth 2 (two) times daily after a meal. 04/25/19   Jene EveryKinner, Robert, MD  oxyCODONE-acetaminophen (PERCOCET/ROXICET) 5-325 MG tablet Take 1 tablet by mouth every 8 (eight) hours as needed for up to 3 days. 07/15/19 07/18/19  Orvil FeilWoods, Navdeep Halt M, PA-C  spironolactone (ALDACTONE) 100 MG tablet Take 100 mg by mouth every morning. 09/14/14   [provider]  tetrahydrozoline-zinc (VISINE-AC) 0.05-0.25 % ophthalmic solution Place 2 drops into the  left eye 3 (three) times daily as needed. 01/18/19   Enid Derry, PA-C  traMADol (ULTRAM) 50 MG tablet Take 1 tablet (50 mg total) by mouth every 6 (six) hours as needed. 04/25/19 04/24/20  Jene Every, MD    Allergies Shellfish allergy and Penicillins  No family history on file.  Social History Social History   Tobacco Use  . Smoking status: Never Smoker  . Smokeless tobacco: Former Engineer, water Use Topics  . Alcohol use: Yes    Comment: occas.   . Drug use: No     Review of Systems  Constitutional: No  fever/chills Eyes:  No discharge ENT: No upper respiratory complaints. Respiratory: no cough. No SOB/ use of accessory muscles to breath Gastrointestinal:   No nausea, no vomiting.  No diarrhea.  No constipation. Musculoskeletal: Patient has right forearm pain.  Skin: Negative for rash, abrasions, lacerations, ecchymosis.    ____________________________________________   PHYSICAL EXAM:  VITAL SIGNS: ED Triage Vitals  Enc Vitals Group     BP 07/15/19 1951 124/66     Pulse Rate 07/15/19 1951 76     Resp 07/15/19 1951 18     Temp 07/15/19 1951 99 F (37.2 C)     Temp Source 07/15/19 1951 Oral     SpO2 07/15/19 1951 96 %     Weight 07/15/19 1947 165 lb (74.8 kg)     Height 07/15/19 1947 5\' 11"  (1.803 m)     Head Circumference --      Peak Flow --      Pain Score 07/15/19 1947 10     Pain Loc --      Pain Edu? --      Excl. in GC? --      Constitutional: Alert and oriented. Well appearing and in no acute distress. Eyes: Conjunctivae are normal. PERRL. EOMI. Head: Atraumatic. ENT: Cardiovascular: Normal rate, regular rhythm. Normal S1 and S2.  Good peripheral circulation. Respiratory: Normal respiratory effort without tachypnea or retractions. Lungs CTAB. Good air entry to the bases with no decreased or absent breath sounds Gastrointestinal: Bowel sounds x 4 quadrants. Soft and nontender to palpation. No guarding or rigidity. No distention. Musculoskeletal: Patient unable to perform full range of motion at the right wrist, likely secondary to pain.  She is able to move all 5 right fingers.  Palpable radial pulse bilaterally and symmetrically. Neurologic:  Normal for age. No gross focal neurologic deficits are appreciated.  Skin:  Skin is warm, dry and intact. No rash noted. Psychiatric: Mood and affect are normal for age. Speech and behavior are normal.   ____________________________________________   LABS (all labs ordered are listed, but only abnormal results are  displayed)  Labs Reviewed - No data to display ____________________________________________  EKG   ____________________________________________  RADIOLOGY 14/10/20, personally viewed and evaluated these images (plain radiographs) as part of my medical decision making, as well as reviewing the written report by the radiologist.  DG Forearm Right  Result Date: 07/15/2019 CLINICAL DATA:  Seizure today. Right arm pain. Patient had a recent previous fracture of the right forearm. EXAM: RIGHT FOREARM - 2 VIEW COMPARISON:  June 30, 2019 FINDINGS: Displaced fracture of distal ulna is identified probably slightly more displaced compared prior exam. No other acute fracture or dislocation is identified. IMPRESSION: Previously noted fracture of distal ulna is probably slightly more displaced compared to prior exam. Electronically Signed   By: July 02, 2019 M.D.   On: 07/15/2019 20:22  ____________________________________________    PROCEDURES  Procedure(s) performed:     Procedures     Medications  oxyCODONE-acetaminophen (PERCOCET/ROXICET) 5-325 MG per tablet 1 tablet (1 tablet Oral Given 07/15/19 2141)     ____________________________________________   INITIAL IMPRESSION / ASSESSMENT AND PLAN / ED COURSE  Pertinent labs & imaging results that were available during my care of the patient were reviewed by me and considered in my medical decision making (see chart for details).      Assessment and Plan: Right Forearm Pain:  48 year old male presents to the emergency department with right forearm pain after he fell while having a seizure.  X-ray examination of the right forearm reveals slight displacement consistent with acute on chronic right ulnar fracture.  Patient was placed in a Velcro wrist splint Percocet was given for pain.  Patient was discharged with a short course of Percocet but was cautioned that he has had multiple prescriptions for narcotics in the  past several months and pain medicine would not be represcribed should he present with similar complaints of pain.  He voiced understanding.    ____________________________________________  FINAL CLINICAL IMPRESSION(S) / ED DIAGNOSES  Final diagnoses:  Pain of right upper extremity      NEW MEDICATIONS STARTED DURING THIS VISIT:  ED Discharge Orders         Ordered    oxyCODONE-acetaminophen (PERCOCET/ROXICET) 5-325 MG tablet  Every 8 hours PRN     07/15/19 2148              This chart was dictated using voice recognition software/Dragon. Despite best efforts to proofread, errors can occur which can change the meaning. Any change was purely unintentional.     Lannie Fields, PA-C 07/15/19 2151    Earleen Newport, MD 07/16/19 [phone]

## 2019-07-15 NOTE — ED Notes (Signed)
Pt states fell on their broken arm. Pt has hx of seizures and had a seizure and fell.

## 2019-10-02 DIAGNOSIS — Z5321 Procedure and treatment not carried out due to patient leaving prior to being seen by health care provider: Secondary | ICD-10-CM | POA: Diagnosis not present

## 2019-10-02 DIAGNOSIS — R519 Headache, unspecified: Secondary | ICD-10-CM | POA: Insufficient documentation

## 2019-10-03 ENCOUNTER — Emergency Department
Admission: EM | Admit: 2019-10-03 | Discharge: 2019-10-03 | Disposition: A | Discharge status: Home / Self Care | Payer: Medicaid Other | Attending: Emergency Medicine | Admitting: Emergency Medicine

## 2019-10-03 ENCOUNTER — Other Ambulatory Visit: Payer: Self-pay

## 2019-10-03 NOTE — ED Notes (Signed)
Pt not in lobby on call x2.

## 2019-10-03 NOTE — ED Triage Notes (Signed)
Patient c/o headache and sinus pressure. Patient reports hx of same in past.

## 2019-10-03 NOTE — ED Notes (Signed)
No answer when called in lobby x3.

## 2019-10-03 NOTE — ED Notes (Signed)
No answer  when called in lobby 

## 2019-10-25 ENCOUNTER — Encounter: Payer: Self-pay | Admitting: Emergency Medicine

## 2019-10-25 ENCOUNTER — Other Ambulatory Visit: Payer: Self-pay

## 2019-10-25 ENCOUNTER — Emergency Department
Admission: EM | Admit: 2019-10-25 | Discharge: 2019-10-25 | Disposition: A | Discharge status: Home / Self Care | Payer: Medicaid Other | Attending: Student | Admitting: Student

## 2019-10-25 DIAGNOSIS — J00 Acute nasopharyngitis [common cold]: Secondary | ICD-10-CM

## 2019-10-25 DIAGNOSIS — J0100 Acute maxillary sinusitis, unspecified: Secondary | ICD-10-CM | POA: Diagnosis not present

## 2019-10-25 DIAGNOSIS — R519 Headache, unspecified: Secondary | ICD-10-CM | POA: Diagnosis present

## 2019-10-25 MED ORDER — AMOXICILLIN 500 MG PO CAPS
500.0000 mg | ORAL_CAPSULE | Freq: Once | ORAL | Status: AC
  Administered 2019-10-25: 22:00:00 500 mg via ORAL
  Filled 2019-10-25: qty 1

## 2019-10-25 MED ORDER — PREDNISONE 20 MG PO TABS: 20.0000 mg | ORAL_TABLET | Freq: Two times a day (BID) | ORAL | 0 refills | Status: AC

## 2019-10-25 MED ORDER — AMOXICILLIN 500 MG PO CAPS: 500.0000 mg | ORAL_CAPSULE | Freq: Three times a day (TID) | ORAL | 0 refills | Status: DC

## 2019-10-25 MED ORDER — DEXAMETHASONE SODIUM PHOSPHATE 10 MG/ML IJ SOLN
10.0000 mg | Freq: Once | INTRAMUSCULAR | Status: AC
  Administered 2019-10-25: 10 mg via INTRAMUSCULAR
  Filled 2019-10-25: qty 1

## 2019-10-25 MED ORDER — ACETAMINOPHEN-CODEINE #3 300-30 MG PO TABS
1.0000 | ORAL_TABLET | Freq: Once | ORAL | Status: AC
  Administered 2019-10-25: 1 via ORAL
  Filled 2019-10-25: qty 1

## 2019-10-25 MED ORDER — FLUTICASONE PROPIONATE 50 MCG/ACT NA SUSP: 2.0000 | Freq: Every day | NASAL | 0 refills | Status: DC

## 2019-10-25 NOTE — ED Triage Notes (Signed)
Pt presents to ED with sinus headache, pressure, and drainage for about 5 weeks. Pt states she has been taking otc medications without relief. Pt was seen at [facility] last night and was told to take allegra d. Pt states she now feels like her face is swollen from her sinuses.

## 2019-10-25 NOTE — Discharge Instructions (Addendum)
You are being treated for a sinus infection and seasonal allergies. Take the prescription meds as directed. Follow-up with [facility] ENT for further treatment. Return as needed.

## 2019-10-26 ENCOUNTER — Encounter: Payer: Self-pay | Admitting: Physician Assistant

## 2019-10-26 MED ORDER — ACETAMINOPHEN-CODEINE #3 300-30 MG PO TABS: 1.0000 | ORAL_TABLET | Freq: Four times a day (QID) | ORAL | 0 refills | Status: DC | PRN

## 2019-10-26 NOTE — ED Provider Notes (Signed)
[facility] Emergency Department Provider Note ____________________________________________  Time seen: 2033  I have reviewed the triage vital signs and the nursing notes.  HISTORY  Chief Complaint  Facial Pain  HPI Jonathan Deleon is a 49 y.o. adult who presents to the ED for evaluation of sinus pressure.  Patient was symptoms have been persistent and worsening for the last 3 to 4 weeks.   She had been taken over-the-counter Synex without significant benefit.  She denies any fever, chills, sweats patient also denies any chest pain, shortness of breath, or syncope.  She was evaluated last night at [facility], and was discharged with a prescription for Allegra-D.  She presents now with concern over facial fullness and what she describes as swelling.  She gives a remote history of a sinus cyst evaluated last year on CT imaging.  No other complaints at this time.  Past Medical History:  Diagnosis Date  . HIV (human immunodeficiency virus infection) ([place])   . Hypertension   . Seizures ([place])   . Stroke [employer])     Patient Active Problem List   Diagnosis Date Noted  . Seizure ([place]) 03/18/2016    Past Surgical History:  Procedure Laterality Date  . BRAIN SURGERY    . COSMETIC SURGERY      Prior to Admission medications   Medication Sig Start Date End Date Taking? Authorizing Provider  Dolutegravir-lamiVUDine 50-300 MG TABS Take 1 tablet by mouth daily.   Yes [provider]  acetaminophen-codeine (TYLENOL #3) 300-30 MG tablet Take 1 tablet by mouth every 6 (six) hours as needed for moderate pain. 10/26/19   Tarick Parenteau, Dannielle Karvonen, PA-C  amoxicillin (AMOXIL) 500 MG capsule Take 1 capsule (500 mg total) by mouth 3 (three) times daily. 10/25/19   Marieke Lubke, Dannielle Karvonen, PA-C  aspirin 325 MG tablet Take 325 mg by mouth daily.    [provider]  efavirenz-emtricitabine-tenofovir (ATRIPLA) 600-200-300 MG per tablet Take 1 tablet by mouth at  bedtime.    [provider]  estradiol (ESTRACE) 1 MG tablet Take 4 mg by mouth daily.    [provider]  fluconazole (DIFLUCAN) 150 MG tablet Take one now and one in a week Patient not taking: Reported on 12/25/2018 08/11/18   Versie Starks, PA-C  fluticasone [facility]) 50 MCG/ACT nasal spray Place 2 sprays into both nostrils daily. 10/25/19   Matalie Romberger, Dannielle Karvonen, PA-C  HYDROcodone-acetaminophen (NORCO/VICODIN) 5-325 MG tablet Take 1 tablet by mouth every 4 (four) hours as needed for moderate pain. 06/15/19   Cuthriell, Charline Bills, PA-C  levETIRAcetam (KEPPRA) 500 MG tablet Take 1 tablet (500 mg total) by mouth 2 (two) times daily. 01/30/18 10/11/18  Merlyn Lot, MD  predniSONE (DELTASONE) 20 MG tablet Take 1 tablet (20 mg total) by mouth 2 (two) times daily with a meal for 5 days. 10/25/19 10/30/19  Khloie Hamada, Dannielle Karvonen, PA-C  spironolactone (ALDACTONE) 100 MG tablet Take 100 mg by mouth every morning. 09/14/14   [provider]  tetrahydrozoline-zinc (VISINE-AC) 0.05-0.25 % ophthalmic solution Place 2 drops into the left eye 3 (three) times daily as needed. 01/18/19   Laban Emperor, PA-C    Allergies Shellfish allergy and Penicillins  History reviewed. No pertinent family history.  Social History Social History   Tobacco Use  . Smoking status: Never Smoker  . Smokeless tobacco: Former Network engineer Use Topics  . Alcohol use: Yes    Comment: occas.   . Drug  use: No    Review of Systems  Constitutional: Negative for fever. Eyes: Negative for visual changes. ENT: Negative for sore throat.  Reports sinus pressure and postnasal drainage as above. Cardiovascular: Negative for chest pain. Respiratory: Negative for shortness of breath. Musculoskeletal: Negative for back pain. Skin: Negative for rash. Neurological: Negative for headaches, focal weakness or numbness. ____________________________________________  PHYSICAL EXAM:  VITAL SIGNS: ED  Triage Vitals [10/25/19 1958]  Enc Vitals Group     BP (!) 153/90     Pulse Rate 87     Resp 20     Temp 98.5 F (36.9 C)     Temp Source Oral     SpO2 100 %     Weight 160 lb (72.6 kg)     Height 5\' 9"  (1.753 m)     Head Circumference      Peak Flow      Pain Score 10     Pain Loc      Pain Edu?      Excl. in [place]?     Constitutional: Alert and oriented. Well appearing and in no distress. Head: Normocephalic and atraumatic.  Facial tenderness to palpation over the maxillary sinuses. Eyes: Conjunctivae are normal. Normal extraocular movements Ears: Canals clear. TMs intact bilaterally.  Bilateral serous effusions appreciated. Nose: No congestion/rhinorrhea/epistaxis. Mouth/Throat: Mucous membranes are moist.  Uvula is midline and tonsils are flat.  No oropharyngeal lesions are appreciated. Neck: Supple. No thyromegaly. Hematological/Lymphatic/Immunological: No cervical lymphadenopathy. Cardiovascular: Normal rate, regular rhythm. Normal distal pulses. Respiratory: Normal respiratory effort. No wheezes/rales/rhonchi. ___________________________________________  PROCEDURES  Decadron 10 mg IM Amoxicillin 500 mg PO Tylenol #3 i PO  Procedures ____________________________________________  INITIAL IMPRESSION / ASSESSMENT AND PLAN / ED COURSE  Patient with ED evaluation of sinus pressure and congestion for the last 3 to 4 weeks.  Patient exam is overall benign return at this time.  She will be treated with steroids, amoxicillin, and Flonase.  She is encouraged to take over-the-counter allergy medicine and decongestant as needed.  She is to follow-up with [facility] ENT specialist for further evaluation of her sinus symptoms.  Return precautions have been reviewed.  [facility] [place]all was evaluated in Emergency Department on 10/26/2019 for the symptoms described in the history of present illness. She was evaluated in the context of the global COVID-19 pandemic, which necessitated  consideration that the patient might be at risk for infection with the SARS-CoV-2 virus that causes COVID-19. Institutional protocols and algorithms that pertain to the evaluation of patients at risk for COVID-19 are in a state of rapid change based on information released by regulatory bodies including the CDC and federal and state organizations. These policies and algorithms were followed during the patient's care in the ED.  I reviewed the patient's prescription history over the last 12 months in the multi-state controlled substances database(s) that includes [place], [place], [place], [place], [place], [place], [place], [place], [place], [place], [place], [place], [place], and [place].  Results were notable for no current RX. ____________________________________________  FINAL CLINICAL IMPRESSION(S) / ED DIAGNOSES  Final diagnoses:  Acute rhinitis  Acute non-recurrent maxillary sinusitis      Aeron Lheureux, [place], PA-C 10/26/19 0030    10/28/19., MD 10/26/19 [phone]

## 2019-11-01 ENCOUNTER — Other Ambulatory Visit: Payer: Self-pay

## 2019-11-01 ENCOUNTER — Ambulatory Visit: Payer: Medicaid Other | Attending: Internal Medicine

## 2019-11-01 DIAGNOSIS — Z23 Encounter for immunization: Secondary | ICD-10-CM

## 2019-11-01 NOTE — Progress Notes (Signed)
   Covid-19 Vaccination Clinic  Name:  Jonathan Deleon    MRN: [id number] DOB: 31-May-1971  11/01/2019  Jonathan Deleon was observed post Covid-19 immunization for 15 minutes without incident. She was provided with Vaccine Information Sheet and instruction to access the V-Safe system.   Jonathan Deleon was instructed to call 911 with any severe reactions post vaccine: Marland Kitchen Difficulty breathing  . Swelling of face and throat  . A fast heartbeat  . A bad rash all over body  . Dizziness and weakness   Immunizations Administered    Name Date Dose VIS Date Route   Pfizer COVID-19 Vaccine 11/01/2019  7:03 PM 0.3 mL 07/16/2019 Intramuscular   Manufacturer: [employer], [employer]   Lot: [id number]   NDC: [address]-1

## 2019-11-28 ENCOUNTER — Ambulatory Visit: Payer: Medicaid Other

## 2019-11-29 ENCOUNTER — Ambulatory Visit: Payer: Medicaid Other

## 2019-12-03 ENCOUNTER — Ambulatory Visit: Payer: Medicaid Other

## 2019-12-28 ENCOUNTER — Other Ambulatory Visit: Payer: Self-pay

## 2019-12-28 ENCOUNTER — Encounter: Payer: Self-pay | Admitting: Emergency Medicine

## 2019-12-28 ENCOUNTER — Emergency Department
Admission: EM | Admit: 2019-12-28 | Discharge: 2019-12-28 | Disposition: A | Discharge status: Home / Self Care | Payer: Medicaid Other | Attending: Emergency Medicine | Admitting: Emergency Medicine

## 2019-12-28 ENCOUNTER — Emergency Department: Payer: Medicaid Other

## 2019-12-28 DIAGNOSIS — Z79899 Other long term (current) drug therapy: Secondary | ICD-10-CM | POA: Diagnosis not present

## 2019-12-28 DIAGNOSIS — Z87891 Personal history of nicotine dependence: Secondary | ICD-10-CM | POA: Diagnosis not present

## 2019-12-28 DIAGNOSIS — S199XXA Unspecified injury of neck, initial encounter: Secondary | ICD-10-CM | POA: Diagnosis present

## 2019-12-28 DIAGNOSIS — Y999 Unspecified external cause status: Secondary | ICD-10-CM | POA: Insufficient documentation

## 2019-12-28 DIAGNOSIS — I10 Essential (primary) hypertension: Secondary | ICD-10-CM | POA: Diagnosis not present

## 2019-12-28 DIAGNOSIS — Y9389 Activity, other specified: Secondary | ICD-10-CM | POA: Insufficient documentation

## 2019-12-28 DIAGNOSIS — S161XXA Strain of muscle, fascia and tendon at neck level, initial encounter: Secondary | ICD-10-CM | POA: Insufficient documentation

## 2019-12-28 DIAGNOSIS — S39012A Strain of muscle, fascia and tendon of lower back, initial encounter: Secondary | ICD-10-CM | POA: Diagnosis not present

## 2019-12-28 DIAGNOSIS — Z7982 Long term (current) use of aspirin: Secondary | ICD-10-CM | POA: Insufficient documentation

## 2019-12-28 DIAGNOSIS — Y92410 Unspecified street and highway as the place of occurrence of the external cause: Secondary | ICD-10-CM | POA: Diagnosis not present

## 2019-12-28 DIAGNOSIS — S0990XA Unspecified injury of head, initial encounter: Secondary | ICD-10-CM | POA: Diagnosis not present

## 2019-12-28 MED ORDER — HYDROCODONE-ACETAMINOPHEN 5-325 MG PO TABS: 1.0000 | ORAL_TABLET | Freq: Three times a day (TID) | ORAL | 0 refills | Status: AC | PRN

## 2019-12-28 MED ORDER — KETOROLAC TROMETHAMINE 10 MG PO TABS: 10.0000 mg | ORAL_TABLET | Freq: Three times a day (TID) | ORAL | 0 refills | Status: DC

## 2019-12-28 MED ORDER — ORPHENADRINE CITRATE 30 MG/ML IJ SOLN
60.0000 mg | INTRAMUSCULAR | Status: AC
  Administered 2019-12-28: 60 mg via INTRAMUSCULAR
  Filled 2019-12-28: qty 2

## 2019-12-28 MED ORDER — KETOROLAC TROMETHAMINE 30 MG/ML IJ SOLN
30.0000 mg | Freq: Once | INTRAMUSCULAR | Status: AC
  Administered 2019-12-28: 30 mg via INTRAMUSCULAR
  Filled 2019-12-28: qty 1

## 2019-12-28 MED ORDER — CYCLOBENZAPRINE HCL 5 MG PO TABS: 5.0000 mg | ORAL_TABLET | Freq: Three times a day (TID) | ORAL | 0 refills | Status: DC | PRN
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
## 2020-01-24 ENCOUNTER — Other Ambulatory Visit: Payer: Self-pay

## 2020-01-24 ENCOUNTER — Encounter: Payer: Self-pay | Admitting: *Deleted

## 2020-01-24 DIAGNOSIS — R300 Dysuria: Secondary | ICD-10-CM | POA: Diagnosis present

## 2020-01-24 DIAGNOSIS — K6289 Other specified diseases of anus and rectum: Secondary | ICD-10-CM | POA: Insufficient documentation

## 2020-01-24 DIAGNOSIS — I10 Essential (primary) hypertension: Secondary | ICD-10-CM | POA: Diagnosis not present

## 2020-01-24 DIAGNOSIS — B20 Human immunodeficiency virus [HIV] disease: Secondary | ICD-10-CM | POA: Diagnosis not present

## 2020-01-24 DIAGNOSIS — Z79899 Other long term (current) drug therapy: Secondary | ICD-10-CM | POA: Diagnosis not present

## 2020-01-24 DIAGNOSIS — N3091 Cystitis, unspecified with hematuria: Secondary | ICD-10-CM | POA: Diagnosis not present

## 2020-01-24 DIAGNOSIS — Z87891 Personal history of nicotine dependence: Secondary | ICD-10-CM | POA: Diagnosis not present

## 2020-01-24 LAB — CBC
HCT: 41.7 % (ref 39.0–52.0)
Hemoglobin: 14 g/dL (ref 13.0–17.0)
MCH: 29 pg (ref 26.0–34.0)
MCHC: 33.6 g/dL (ref 30.0–36.0)
MCV: 86.3 fL (ref 80.0–100.0)
Platelets: 259 10*3/uL (ref 150–400)
RBC: 4.83 MIL/uL (ref 4.22–5.81)
RDW: 13.2 % (ref 11.5–15.5)
WBC: 6.8 10*3/uL (ref 4.0–10.5)
nRBC: 0 % (ref 0.0–0.2)

## 2020-01-24 LAB — COMPREHENSIVE METABOLIC PANEL
ALT: 14 U/L (ref 0–44)
AST: 19 U/L (ref 15–41)
Albumin: 4.1 g/dL (ref 3.5–5.0)
Alkaline Phosphatase: 70 U/L (ref 38–126)
Anion gap: 6 (ref 5–15)
BUN: 15 mg/dL (ref 6–20)
CO2: 25 mmol/L (ref 22–32)
Calcium: 8.6 mg/dL — ABNORMAL LOW (ref 8.9–10.3)
Chloride: 107 mmol/L (ref 98–111)
Creatinine, Ser: 1.2 mg/dL (ref 0.61–1.24)
GFR calc Af Amer: >60 mL/min (ref >60)
GFR calc non Af Amer: >60 mL/min (ref >60)
Glucose, Bld: 113 mg/dL — ABNORMAL HIGH (ref 70–99)
Potassium: 4 mmol/L (ref 3.5–5.1)
Sodium: 138 mmol/L (ref 135–145)
Total Bilirubin: 0.5 mg/dL (ref 0.3–1.2)
Total Protein: 7.4 g/dL (ref 6.5–8.1)

## 2020-01-24 LAB — LIPASE, BLOOD: Lipase: 28 U/L (ref 11–51)

## 2020-01-24 NOTE — ED Triage Notes (Signed)
Pt reports having "churning" in stomach when laying down and dysuria.  Intermittent headache and congestion reported.

## 2020-01-25 ENCOUNTER — Emergency Department: Payer: Medicaid Other

## 2020-01-25 ENCOUNTER — Emergency Department
Admission: EM | Admit: 2020-01-25 | Discharge: 2020-01-25 | Disposition: A | Discharge status: Home / Self Care | Payer: Medicaid Other | Attending: Emergency Medicine | Admitting: Emergency Medicine

## 2020-01-25 DIAGNOSIS — N3001 Acute cystitis with hematuria: Secondary | ICD-10-CM

## 2020-01-25 DIAGNOSIS — K6289 Other specified diseases of anus and rectum: Secondary | ICD-10-CM

## 2020-01-25 LAB — URINALYSIS, COMPLETE (UACMP) WITH MICROSCOPIC
Bilirubin Urine: NEGATIVE
Glucose, UA: NEGATIVE mg/dL
Hgb urine dipstick: NEGATIVE
Ketones, ur: NEGATIVE mg/dL
Nitrite: NEGATIVE
Protein, ur: NEGATIVE mg/dL
Specific Gravity, Urine: 1.025 (ref 1.005–1.030)
pH: 5 (ref 5.0–8.0)

## 2020-01-25 MED ORDER — SULFAMETHOXAZOLE-TRIMETHOPRIM 800-160 MG PO TABS: 1.0000 | ORAL_TABLET | Freq: Two times a day (BID) | ORAL | 0 refills | Status: AC

## 2020-01-25 MED ORDER — KETOROLAC TROMETHAMINE 30 MG/ML IJ SOLN
15.0000 mg | Freq: Once | INTRAMUSCULAR | Status: AC
  Administered 2020-01-25: 15 mg via INTRAVENOUS
  Filled 2020-01-25: qty 1

## 2020-01-25 MED ORDER — SODIUM CHLORIDE 0.9 % IV SOLN
1.0000 g | Freq: Once | INTRAVENOUS | Status: AC
  Administered 2020-01-25: 1 g via INTRAVENOUS
  Filled 2020-01-25: qty 10

## 2020-01-25 MED ORDER — IBUPROFEN 600 MG PO TABS: 600.0000 mg | ORAL_TABLET | Freq: Four times a day (QID) | ORAL | 0 refills | Status: DC | PRN

## 2020-01-25 NOTE — ED Provider Notes (Signed)
[facility] Emergency Department Provider Note  ____________________________________________  Time seen: Approximately 3:14 AM  I have reviewed the triage vital signs and the nursing notes.   HISTORY  Chief Complaint Abdominal Pain and Dysuria   HPI Jonathan Deleon is a 49 y.o. adult male to male transgender with a history of HIV (undetectable viral load and CD4 count of 550 on October 2020), hypertension, seizures who presents for evaluation of bilateral flank pain and dysuria.  Patient has had 2 weeks of dull bilateral flank pain, suprapubic pain, and burning with urination.  Pain is constant and moderate in intensity.  Patient has had prior UTIs in the past per endorses compliance with antiretroviral therapy.  No fever chills, no nausea or vomiting.   No prior history of kidney stones  Past Medical History:  Diagnosis Date  . HIV (human immunodeficiency virus infection) ([place])   . Hypertension   . Seizures ([place])   . Stroke [facility])     Patient Active Problem List   Diagnosis Date Noted  . Seizure ([place]) 03/18/2016    Past Surgical History:  Procedure Laterality Date  . BRAIN SURGERY    . COSMETIC SURGERY      Prior to Admission medications   Medication Sig Start Date End Date Taking? Authorizing Provider  aspirin 325 MG tablet Take 325 mg by mouth daily.    [provider]  cyclobenzaprine (FLEXERIL) 5 MG tablet Take 1 tablet (5 mg total) by mouth 3 (three) times daily as needed. 12/28/19   Menshew, Dannielle Karvonen, PA-C  Dolutegravir-lamiVUDine 50-300 MG TABS Take 1 tablet by mouth daily.    [provider]  efavirenz-emtricitabine-tenofovir (ATRIPLA) 600-200-300 MG per tablet Take 1 tablet by mouth at bedtime.    [provider]  estradiol (ESTRACE) 1 MG tablet Take 4 mg by mouth daily.    [provider]  fluticasone (FLONASE) 50 MCG/ACT nasal spray Place 2 sprays into both nostrils daily. 10/25/19   Menshew,  Dannielle Karvonen, PA-C  ibuprofen (ADVIL) 600 MG tablet Take 1 tablet (600 mg total) by mouth every 6 (six) hours as needed. 01/25/20   Rudene Re, MD  ketorolac (TORADOL) 10 MG tablet Take 1 tablet (10 mg total) by mouth every 8 (eight) hours. 12/28/19   Menshew, Dannielle Karvonen, PA-C  levETIRAcetam (KEPPRA) 500 MG tablet Take 1 tablet (500 mg total) by mouth 2 (two) times daily. 01/30/18 10/11/18  Merlyn Lot, MD  spironolactone (ALDACTONE) 100 MG tablet Take 100 mg by mouth every morning. 09/14/14   [provider]  sulfamethoxazole-trimethoprim (BACTRIM DS) 800-160 MG tablet Take 1 tablet by mouth 2 (two) times daily for 10 days. 01/25/20 02/04/20  Rudene Re, MD  tetrahydrozoline-zinc (VISINE-AC) 0.05-0.25 % ophthalmic solution Place 2 drops into the left eye 3 (three) times daily as needed. 01/18/19   Laban Emperor, PA-C    Allergies Shellfish allergy and Penicillins  History reviewed. No pertinent family history.  Social History Social History   Tobacco Use  . Smoking status: Never Smoker  . Smokeless tobacco: Former Network engineer  . Vaping Use: Never used  Substance Use Topics  . Alcohol use: Yes    Comment: occas.   . Drug use: No    Review of Systems  Constitutional: Negative for fever. Eyes: Negative for visual changes. ENT: Negative for sore throat. Neck: No neck pain  Cardiovascular: Negative for chest pain. Respiratory: Negative for shortness of breath. Gastrointestinal: + suprapubic  abdominal pain. No vomiting or diarrhea. Genitourinary: + dysuria, bilateral flank pain Musculoskeletal: Negative for back pain. Skin: Negative for rash. Neurological: Negative for headaches, weakness or numbness. Psych: No SI or HI  ____________________________________________   PHYSICAL EXAM:  VITAL SIGNS: ED Triage Vitals  Enc Vitals Group     BP 01/24/20 2259 (!) 141/102     Pulse Rate 01/24/20 2259 88     Resp 01/24/20 2259 16     Temp  01/24/20 2259 99 F (37.2 C)     Temp Source 01/24/20 2259 Oral     SpO2 01/24/20 2259 100 %     Weight 01/24/20 2300 155 lb (70.3 kg)     Height 01/24/20 2300 5\' 9"  (1.753 m)     Head Circumference --      Peak Flow --      Pain Score 01/24/20 2300 10     Pain Loc --      Pain Edu? --      Excl. in GC? --     Constitutional: Alert and oriented. Well appearing and in no apparent distress. HEENT:      Head: Normocephalic and atraumatic.         Eyes: Conjunctivae are normal. Sclera is non-icteric.       Mouth/Throat: Mucous membranes are moist.       Neck: Supple with no signs of meningismus. Cardiovascular: Regular rate and rhythm. No murmurs, gallops, or rubs.  Respiratory: Normal respiratory effort. Lungs are clear to auscultation bilaterally.  Gastrointestinal: Soft, tender to palpation suprapubically, and non distended with positive bowel sounds. No rebound or guarding. Genitourinary: b/l CVA tenderness. Musculoskeletal: No edema, cyanosis, or erythema of extremities. Neurologic: Normal speech and language. Face is symmetric. Moving all extremities. No gross focal neurologic deficits are appreciated. Skin: Skin is warm, dry and intact. No rash noted. Psychiatric: Mood and affect are normal. Speech and behavior are normal.  ____________________________________________   LABS (all labs ordered are listed, but only abnormal results are displayed)  Labs Reviewed  COMPREHENSIVE METABOLIC PANEL - Abnormal; Notable for the following components:      Result Value   Glucose, Bld 113 (*)    Calcium 8.6 (*)    All other components within normal limits  URINALYSIS, COMPLETE (UACMP) WITH MICROSCOPIC - Abnormal; Notable for the following components:   Color, Urine YELLOW (*)    APPearance HAZY (*)    Leukocytes,Ua TRACE (*)    Bacteria, UA FEW (*)    All other components within normal limits  URINE CULTURE  LIPASE, BLOOD  CBC    ____________________________________________  EKG  none  ____________________________________________  RADIOLOGY  I have personally reviewed the images performed during this visit and I agree with the Radiologist's read.   Interpretation by Radiologist:  No results found.    ____________________________________________   PROCEDURES  Procedure(s) performed: None Procedures Critical Care performed:  None ____________________________________________   INITIAL IMPRESSION / ASSESSMENT AND PLAN / ED COURSE   49 y.o. adult male to male transgender with a history of HIV (undetectable viral load and CD4 count of 550 on October 2020), hypertension, seizures who presents for evaluation of bilateral flank pain and dysuria.  Patient is well-appearing no distress with normal vitals, bilateral flank tenderness and suprapubic tenderness on exam.  UA positive for UTI.  No signs of sepsis with no white count, no fever, no tachycardia.  Since patient has bilateral flank pain will send for CT to rule out an obstructing stone  in the setting of UTI.  Review of old medical records show the patient's prior UTIs grew E. coli sensitive to Rocephin.  Will give a dose of IV Rocephin and IV Toradol.    _________________________ 5:17 AM on 01/25/2020 -----------------------------------------  CT consistent with cystitis, no signs of pyelonephritis or kidney stone, confirmed by radiology.  According to radiologist there is also some wall thickening and hazy stranding around the rectum concerning for proctitis.  Discussed this finding with the patient and recommended follow-up with her PCP for further evaluation of this.  Patient received IV rocephin and will be dc on bactrim.  Discussed my standard return precautions and follow-up.     _____________________________________________ Please note:  Patient was evaluated in Emergency Department today for the symptoms described in the history of present  illness. Patient was evaluated in the context of the global COVID-19 pandemic, which necessitated consideration that the patient might be at risk for infection with the SARS-CoV-2 virus that causes COVID-19. Institutional protocols and algorithms that pertain to the evaluation of patients at risk for COVID-19 are in a state of rapid change based on information released by regulatory bodies including the CDC and federal and state organizations. These policies and algorithms were followed during the patient's care in the ED.  Some ED evaluations and interventions may be delayed as a result of limited staffing during the pandemic.   [place] Controlled Substance Database was reviewed by me. ____________________________________________   FINAL CLINICAL IMPRESSION(S) / ED DIAGNOSES   Final diagnoses:  Acute cystitis with hematuria  Proctitis      NEW MEDICATIONS STARTED DURING THIS VISIT:  ED Discharge Orders         Ordered    sulfamethoxazole-trimethoprim (BACTRIM DS) 800-160 MG tablet  2 times daily     Discontinue  Reprint     01/25/20 0515    ibuprofen (ADVIL) 600 MG tablet  Every 6 hours PRN     Discontinue  Reprint     01/25/20 0515           Note:  This document was prepared using Dragon voice recognition software and may include unintentional dictation errors.    Don Perking, [place], MD 01/25/20 [phone]

## 2020-01-25 NOTE — ED Notes (Signed)
Pt resting quietly on stretcher in exam room with no distress noted; pt reports for last 3wks has had lower abd/back pain, dysuria and sinus pressure/congestion

## 2020-01-25 NOTE — ED Notes (Signed)
Pt to CT via stretcher accomp by CT tech 

## 2020-01-25 NOTE — ED Notes (Signed)
Patient discharged to home per MD order. Patient in stable condition, and deemed medically cleared by ED provider for discharge. Discharge instructions reviewed with patient/family using "Teach Back"; verbalized understanding of medication education and administration, and information about follow-up care. Denies further concerns. ° °

## 2020-01-27 LAB — URINE CULTURE: Culture: >=100000 — AB

## 2020-01-28 NOTE — Progress Notes (Addendum)
ED Antimicrobial Stewardship Positive Culture Follow Up   Jonathan Deleon is an 49 y.o. adult who presented to [facility] on 01/25/2020 with a chief complaint of  Chief Complaint  Patient presents with  . Abdominal Pain  . Dysuria    Recent Results (from the past 720 hour(s))  Urine culture     Status: Abnormal   Collection Time: 01/24/20 11:05 PM   Specimen: Urine, Random  Result Value Ref Range Status   Specimen Description   Final    URINE, RANDOM Performed at [facility], [address], [place], [place] [address]    Special Requests   Final    NONE Performed at [facility], [address] [place], [place] [address]    Culture >=100,000 COLONIES/mL ESCHERICHIA COLI (A)  Final   Report Status 01/27/2020 FINAL  Final   Organism ID, Bacteria ESCHERICHIA COLI (A)  Final      Susceptibility   Escherichia coli - MIC*    AMPICILLIN >=32 RESISTANT Resistant     CEFAZOLIN <=4 SENSITIVE Sensitive     CEFTRIAXONE <=0.25 SENSITIVE Sensitive     CIPROFLOXACIN >=4 RESISTANT Resistant     GENTAMICIN <=1 SENSITIVE Sensitive     IMIPENEM <=0.25 SENSITIVE Sensitive     NITROFURANTOIN <=16 SENSITIVE Sensitive     TRIMETH/SULFA >=320 RESISTANT Resistant     AMPICILLIN/SULBACTAM 16 INTERMEDIATE Intermediate     PIP/TAZO <=4 SENSITIVE Sensitive     * >=100,000 COLONIES/mL ESCHERICHIA COLI    [x]  Treated with sulfamethoxazole-trimethoprim, organism resistant to prescribed antimicrobial []  Patient discharged originally without antimicrobial agent and treatment is now indicated  New antibiotic prescription: Keflex 1000 mg PO BID x7 days per MD preference  ED Provider: Dr. calling phone number in chart for patient [phone]) to relay need for change in abx therapy. No answer x3 over couple hours. Left message for call back.   8:05 PM - Able to reach patient; discussed culture results and need to switch to different antibiotic (keflex as above).  Preferred pharmacy is [facility] on [address] Rx called in and left as message as [facility] pharmacy is currently closed.    ([phone] 01/28/2020, 3:28 PM Clinical Pharmacist

## 2020-03-03 ENCOUNTER — Other Ambulatory Visit: Payer: Self-pay | Admitting: Orthopedic Surgery

## 2020-03-03 DIAGNOSIS — S52691D Other fracture of lower end of right ulna, subsequent encounter for closed fracture with routine healing: Secondary | ICD-10-CM

## 2020-03-14 ENCOUNTER — Ambulatory Visit: Payer: Medicaid Other | Attending: Internal Medicine

## 2020-03-14 ENCOUNTER — Ambulatory Visit: Payer: Self-pay

## 2020-03-14 DIAGNOSIS — Z23 Encounter for immunization: Secondary | ICD-10-CM

## 2020-03-14 NOTE — Progress Notes (Signed)
   Covid-19 Vaccination Clinic  Name:  Jonathan Deleon    MRN: [id number] DOB: October 03, 1970  03/14/2020  [name] was observed post Covid-19 immunization for 15 minutes without incident. She was provided with Vaccine Information Sheet and instruction to access the V-Safe system.   [name] was instructed to call 911 with any severe reactions post vaccine: Marland Kitchen Difficulty breathing  . Swelling of face and throat  . A fast heartbeat  . A bad rash all over body  . Dizziness and weakness   Immunizations Administered    Name Date Dose VIS Date Route   Pfizer COVID-19 Vaccine 03/14/2020  1:50 PM 0.3 mL 09/29/2018 Intramuscular   Manufacturer: [employer], [employer]   Lot: [id number]   NDC: [address]-8

## 2020-03-21 ENCOUNTER — Ambulatory Visit: Payer: Medicaid Other

## 2020-04-02 ENCOUNTER — Ambulatory Visit: Payer: Medicaid Other

## 2020-05-10 ENCOUNTER — Other Ambulatory Visit: Payer: Self-pay

## 2020-05-10 ENCOUNTER — Emergency Department: Payer: Medicaid Other

## 2020-05-10 DIAGNOSIS — Z87891 Personal history of nicotine dependence: Secondary | ICD-10-CM | POA: Diagnosis not present

## 2020-05-10 DIAGNOSIS — J0191 Acute recurrent sinusitis, unspecified: Secondary | ICD-10-CM | POA: Insufficient documentation

## 2020-05-10 DIAGNOSIS — M79661 Pain in right lower leg: Secondary | ICD-10-CM | POA: Insufficient documentation

## 2020-05-10 DIAGNOSIS — M7989 Other specified soft tissue disorders: Secondary | ICD-10-CM | POA: Insufficient documentation

## 2020-05-10 DIAGNOSIS — I10 Essential (primary) hypertension: Secondary | ICD-10-CM | POA: Insufficient documentation

## 2020-05-10 DIAGNOSIS — Z5321 Procedure and treatment not carried out due to patient leaving prior to being seen by health care provider: Secondary | ICD-10-CM | POA: Insufficient documentation
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
## 2020-05-11 ENCOUNTER — Other Ambulatory Visit: Payer: Self-pay

## 2020-05-11 ENCOUNTER — Emergency Department: Payer: Medicaid Other

## 2020-05-11 ENCOUNTER — Emergency Department
Admission: EM | Admit: 2020-05-11 | Discharge: 2020-05-11 | Disposition: A | Discharge status: Home / Self Care | Payer: Medicaid Other | Source: Home / Self Care | Attending: Emergency Medicine | Admitting: Emergency Medicine

## 2020-05-11 ENCOUNTER — Emergency Department
Admission: EM | Admit: 2020-05-11 | Discharge: 2020-05-11 | Disposition: A | Discharge status: Home / Self Care | Payer: Medicaid Other | Attending: Emergency Medicine | Admitting: Emergency Medicine

## 2020-05-11 DIAGNOSIS — Z87891 Personal history of nicotine dependence: Secondary | ICD-10-CM | POA: Insufficient documentation

## 2020-05-11 DIAGNOSIS — M79604 Pain in right leg: Secondary | ICD-10-CM

## 2020-05-11 DIAGNOSIS — J0191 Acute recurrent sinusitis, unspecified: Secondary | ICD-10-CM | POA: Insufficient documentation

## 2020-05-11 DIAGNOSIS — I10 Essential (primary) hypertension: Secondary | ICD-10-CM | POA: Insufficient documentation

## 2020-05-11 MED ORDER — HYDROCODONE-ACETAMINOPHEN 5-325 MG PO TABS
1.0000 | ORAL_TABLET | Freq: Once | ORAL | Status: AC
  Administered 2020-05-11: 1 via ORAL
  Filled 2020-05-11: qty 1

## 2020-05-11 MED ORDER — FLUCONAZOLE 150 MG PO TABS: ORAL_TABLET | ORAL | 0 refills | Status: DC

## 2020-05-11 MED ORDER — TRAMADOL HCL 50 MG PO TABS: 50.0000 mg | ORAL_TABLET | Freq: Four times a day (QID) | ORAL | 0 refills | Status: DC | PRN

## 2020-05-11 MED ORDER — SULFAMETHOXAZOLE-TRIMETHOPRIM 800-160 MG PO TABS: 1.0000 | ORAL_TABLET | Freq: Two times a day (BID) | ORAL | 1 refills | Status: DC

## 2020-05-11 NOTE — ED Notes (Signed)
[place] froze. Pt signed printed d/c paperwork.

## 2020-05-11 NOTE — ED Notes (Signed)
No answer when called several times from lobby 

## 2020-05-11 NOTE — ED Provider Notes (Signed)
[facility] Emergency Department Provider Note  ____________________________________________   First MD Initiated Contact with Patient 05/11/20 1502     (approximate)  I have reviewed the triage vital signs and the nursing notes.   HISTORY  Chief Complaint Leg Pain    HPI Jonathan Deleon is a 49 y.o. adult presents emergency department complaining of right leg pain.  Patient states that she was here last night and had labs and ultrasound performed.  States she does not know why the right leg continues to swell and be very painful.  Also feels like she has sinus infection.  No fever or chills.  Patient is HIV positive.    Past Medical History:  Diagnosis Date  . HIV (human immunodeficiency virus infection) ([facility])   . Hypertension   . Seizures ([facility])   . Stroke [facility])     Patient Active Problem List   Diagnosis Date Noted  . Seizure ([facility]) 03/18/2016    Past Surgical History:  Procedure Laterality Date  . BRAIN SURGERY    . COSMETIC SURGERY      Prior to Admission medications   Medication Sig Start Date End Date Taking? Authorizing Provider  aspirin 325 MG tablet Take 325 mg by mouth daily.    [provider]  cyclobenzaprine (FLEXERIL) 5 MG tablet Take 1 tablet (5 mg total) by mouth 3 (three) times daily as needed. 12/28/19   Menshew, Charlesetta Ivory, PA-C  Dolutegravir-lamiVUDine 50-300 MG TABS Take 1 tablet by mouth daily.    [provider]  efavirenz-emtricitabine-tenofovir (ATRIPLA) 600-200-300 MG per tablet Take 1 tablet by mouth at bedtime.    [provider]  estradiol (ESTRACE) 1 MG tablet Take 4 mg by mouth daily.    [provider]  fluconazole (DIFLUCAN) 150 MG tablet Take one now and one in a week 05/11/20   Sherrie Mustache, Roselyn Bering, PA-C  fluticasone [facility]) 50 MCG/ACT nasal spray Place 2 sprays into both nostrils daily. 10/25/19   Menshew, Charlesetta Ivory, PA-C  levETIRAcetam (KEPPRA) 500 MG tablet Take 1  tablet (500 mg total) by mouth 2 (two) times daily. 01/30/18 10/11/18  Willy Eddy, MD  spironolactone (ALDACTONE) 100 MG tablet Take 100 mg by mouth every morning. 09/14/14   [provider]  sulfamethoxazole-trimethoprim (BACTRIM DS) 800-160 MG tablet Take 1 tablet by mouth 2 (two) times daily. 05/11/20   Mikaila Grunert, Roselyn Bering, PA-C  tetrahydrozoline-zinc (VISINE-AC) 0.05-0.25 % ophthalmic solution Place 2 drops into the left eye 3 (three) times daily as needed. 01/18/19   Enid Derry, PA-C  traMADol (ULTRAM) 50 MG tablet Take 1 tablet (50 mg total) by mouth every 6 (six) hours as needed. 05/11/20   Faythe Ghee, PA-C    Allergies Shellfish allergy and Penicillins  No family history on file.  Social History Social History   Tobacco Use  . Smoking status: Never Smoker  . Smokeless tobacco: Former Clinical biochemist  . Vaping Use: Never used  Substance Use Topics  . Alcohol use: Yes    Comment: occas.   . Drug use: No    Review of Systems  Constitutional: No fever/chills Eyes: No visual changes. ENT: No sore throat.  Positive sinus congestion Respiratory: Denies cough Cardiovascular: Denies chest pain Gastrointestinal: Denies abdominal pain Genitourinary: Negative for dysuria. Musculoskeletal: Negative for back pain.  Positive right leg pain Skin: Negative for rash. Psychiatric: no mood changes,     ____________________________________________   PHYSICAL EXAM:  VITAL SIGNS:  ED Triage Vitals  Enc Vitals Group     BP 05/11/20 1452 98/67     Pulse Rate 05/11/20 1452 89     Resp 05/11/20 1452 18     Temp 05/11/20 1452 98.2 F (36.8 C)     Temp src --      SpO2 05/11/20 1452 99 %     Weight 05/11/20 1453 175 lb (79.4 kg)     Height 05/11/20 1453 5\' 10"  (1.778 m)     Head Circumference --      Peak Flow --      Pain Score 05/11/20 1452 10     Pain Loc --      Pain Edu? --      Excl. in GC? --     Constitutional: Alert and oriented. Well appearing and  in no acute distress. Eyes: Conjunctivae are normal.  Head: Atraumatic.  Frontal sinuses are tender Nose: No congestion/rhinnorhea.  Nasal mucosa is red and swollen Mouth/Throat: Mucous membranes are moist.   Neck:  supple no lymphadenopathy noted Cardiovascular: Normal rate, regular rhythm. Heart sounds are normal Respiratory: Normal respiratory effort.  No retractions, lungs c t a  GU: deferred Musculoskeletal: FROM all extremities, warm and well perfused, right leg does appear to be much larger than the left, tibia and calf are tender.  Neurovascular is intact.  Right foot is also tender. Neurologic:  Normal speech and language.  Skin:  Skin is warm, dry and intact. No rash noted. Psychiatric: Mood and affect are normal. Speech and behavior are normal.  ____________________________________________   LABS (all labs ordered are listed, but only abnormal results are displayed)  Labs Reviewed - No data to display ____________________________________________   ____________________________________________  RADIOLOGY  X-ray of the right tib-fib and right foot are both negative for any acute abnormalities Ultrasound to rule out DVT from last night is negative  ____________________________________________   PROCEDURES  Procedure(s) performed: No  Procedures    ____________________________________________   INITIAL IMPRESSION / ASSESSMENT AND PLAN / ED COURSE  Pertinent labs & imaging results that were available during my care of the patient were reviewed by me and considered in my medical decision making (see chart for details).   Patient is a 49 year old male to male presents with right leg pain and swelling.  See HPI.  Patient does take replacement hormones.  Physical exam shows her right leg to be larger than the left.  Right foot is tender., frontal sinuses tender.  Ultrasound and lab work from last night are negative.  I did explain findings to the patient.  X-ray  to ensure there is no hairline fracture along the right lower leg and foot.  Both are negative.  Patient be treated for sinus infection with Bactrim.  She is given crutches since it hurts to bear weight on her right leg.  Pain medication and discharged in stable condition.  Follow-up with Ortho.     Jonathan 42 A Cal[place] was evaluated in Emergency Department on 05/11/2020 for the symptoms described in the history of present illness. She was evaluated in the context of the global COVID-19 pandemic, which necessitated consideration that the patient might be at risk for infection with the SARS-CoV-2 virus that causes COVID-19. Institutional protocols and algorithms that pertain to the evaluation of patients at risk for COVID-19 are in a state of rapid change based on information released by regulatory bodies including the CDC and federal and state organizations. These policies and algorithms were followed during  the patient's care in the ED.    As part of my medical decision making, I reviewed the following data within the electronic MEDICAL RECORD NUMBER Nursing notes reviewed and incorporated, Labs reviewed , Old chart reviewed, Radiograph reviewed , Notes from prior ED visits and [place] Controlled Substance Database  ____________________________________________   FINAL CLINICAL IMPRESSION(S) / ED DIAGNOSES  Final diagnoses:  Right leg pain  Acute recurrent sinusitis, unspecified location      NEW MEDICATIONS STARTED DURING THIS VISIT:  Discharge Medication List as of 05/11/2020  4:48 PM    START taking these medications   Details  fluconazole (DIFLUCAN) 150 MG tablet Take one now and one in a week, Normal    sulfamethoxazole-trimethoprim (BACTRIM DS) 800-160 MG tablet Take 1 tablet by mouth 2 (two) times daily., Starting Thu 05/11/2020, Normal    traMADol (ULTRAM) 50 MG tablet Take 1 tablet (50 mg total) by mouth every 6 (six) hours as needed., Starting Thu 05/11/2020, Normal         Note:  This  document was prepared using Dragon voice recognition software and may include unintentional dictation errors.    Faythe Ghee, PA-C 05/11/20 Luiz Iron    Jene Every, MD 05/12/20 [phone]

## 2020-05-11 NOTE — Discharge Instructions (Addendum)
Follow-up with your regular doctor as needed.  Follow-up with orthopedics for evaluation of the continued foot pain.  Use the crutches to to prevent weightbearing on the affected foot.  Take medications as prescribed.  Return emergency department if your leg continues to swell and is worsening.  Your chiropractor note there are no fractures in your leg and foot.  The swelling may be more vascular than orthopedic.  You do not have a blood clot in your leg at this time.

## 2020-05-11 NOTE — ED Triage Notes (Signed)
Pt to the er for sinus drainage and leg pain. Pt was here last night for testing.

## 2020-05-11 NOTE — ED Notes (Signed)
Pt up to restroom. States ride is on the way. Pt to be wheeled out to lobby momentarily.

## 2020-05-11 NOTE — ED Notes (Signed)
Provider at bedside. Pt in with continued c/o R leg pain. R leg slightly swollen. Pt has leg elevated currently. Pt reports pain runs from lower leg up into back. R leg warmth WDL, dorsalis pedis pulse 2+, appropriate color.

## 2020-08-01 ENCOUNTER — Other Ambulatory Visit: Payer: Self-pay

## 2020-08-01 DIAGNOSIS — Z79899 Other long term (current) drug therapy: Secondary | ICD-10-CM | POA: Diagnosis not present

## 2020-08-01 DIAGNOSIS — J0110 Acute frontal sinusitis, unspecified: Secondary | ICD-10-CM | POA: Diagnosis not present

## 2020-08-01 DIAGNOSIS — Z7982 Long term (current) use of aspirin: Secondary | ICD-10-CM | POA: Insufficient documentation

## 2020-08-01 DIAGNOSIS — Z21 Asymptomatic human immunodeficiency virus [HIV] infection status: Secondary | ICD-10-CM | POA: Insufficient documentation

## 2020-08-01 DIAGNOSIS — R0981 Nasal congestion: Secondary | ICD-10-CM | POA: Diagnosis present

## 2020-08-01 DIAGNOSIS — I10 Essential (primary) hypertension: Secondary | ICD-10-CM | POA: Diagnosis not present

## 2020-08-01 NOTE — ED Triage Notes (Signed)
Pt to ED reporting nasal congestion and nasal drainage. Headache reported.

## 2020-08-02 ENCOUNTER — Emergency Department
Admission: EM | Admit: 2020-08-02 | Discharge: 2020-08-02 | Disposition: A | Discharge status: Home / Self Care | Payer: Medicaid Other | Attending: Emergency Medicine | Admitting: Emergency Medicine

## 2020-08-02 DIAGNOSIS — J0111 Acute recurrent frontal sinusitis: Secondary | ICD-10-CM

## 2020-08-02 MED ORDER — SULFAMETHOXAZOLE-TRIMETHOPRIM 800-160 MG PO TABS
1.0000 | ORAL_TABLET | Freq: Once | ORAL | Status: AC
  Administered 2020-08-02: 1 via ORAL
  Filled 2020-08-02: qty 1

## 2020-08-02 MED ORDER — PHENYLEPHRINE-ACETAMINOPHEN 5-500 MG PO TABS: 1.0000 | ORAL_TABLET | Freq: Three times a day (TID) | ORAL | 0 refills | Status: DC | PRN

## 2020-08-02 MED ORDER — SULFAMETHOXAZOLE-TRIMETHOPRIM 800-160 MG PO TABS: 1.0000 | ORAL_TABLET | Freq: Two times a day (BID) | ORAL | 0 refills | Status: AC
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
## 2020-10-31 ENCOUNTER — Emergency Department
Admission: EM | Admit: 2020-10-31 | Discharge: 2020-10-31 | Disposition: A | Discharge status: Home / Self Care | Payer: Medicaid Other | Attending: Emergency Medicine | Admitting: Emergency Medicine

## 2020-10-31 ENCOUNTER — Encounter: Payer: Self-pay | Admitting: *Deleted

## 2020-10-31 ENCOUNTER — Other Ambulatory Visit: Payer: Self-pay

## 2020-10-31 DIAGNOSIS — J3489 Other specified disorders of nose and nasal sinuses: Secondary | ICD-10-CM | POA: Diagnosis present

## 2020-10-31 DIAGNOSIS — I10 Essential (primary) hypertension: Secondary | ICD-10-CM | POA: Insufficient documentation

## 2020-10-31 DIAGNOSIS — J0111 Acute recurrent frontal sinusitis: Secondary | ICD-10-CM

## 2020-10-31 DIAGNOSIS — Z21 Asymptomatic human immunodeficiency virus [HIV] infection status: Secondary | ICD-10-CM | POA: Diagnosis not present

## 2020-10-31 DIAGNOSIS — Z8673 Personal history of transient ischemic attack (TIA), and cerebral infarction without residual deficits: Secondary | ICD-10-CM | POA: Diagnosis not present

## 2020-10-31 MED ORDER — PSEUDOEPH-BROMPHEN-DM 30-2-10 MG/5ML PO SYRP: 10.0000 mL | ORAL_SOLUTION | Freq: Four times a day (QID) | ORAL | 0 refills | Status: DC | PRN

## 2020-10-31 MED ORDER — SULFAMETHOXAZOLE-TRIMETHOPRIM 800-160 MG PO TABS: 1.0000 | ORAL_TABLET | Freq: Two times a day (BID) | ORAL | 0 refills | Status: AC

## 2020-10-31 MED ORDER — OXYCODONE-ACETAMINOPHEN 5-325 MG PO TABS
1.0000 | ORAL_TABLET | Freq: Once | ORAL | Status: AC
  Administered 2020-10-31: 1 via ORAL
  Filled 2020-10-31: qty 1

## 2020-10-31 MED ORDER — LIDOCAINE VISCOUS HCL 2 % MT SOLN: 10.0000 mL | OROMUCOSAL | 0 refills | Status: DC | PRN

## 2020-10-31 MED ORDER — SULFAMETHOXAZOLE-TRIMETHOPRIM 800-160 MG PO TABS
1.0000 | ORAL_TABLET | Freq: Once | ORAL | Status: AC
  Administered 2020-10-31: 1 via ORAL
  Filled 2020-10-31: qty 1

## 2020-10-31 MED ORDER — BENZONATATE 100 MG PO CAPS: 100.0000 mg | ORAL_CAPSULE | Freq: Four times a day (QID) | ORAL | 0 refills | Status: DC | PRN

## 2020-10-31 NOTE — ED Triage Notes (Signed)
Pt has sore throat, sinus congestion and cough  Pt alert  Speech clear.
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
## 2021-01-29 ENCOUNTER — Encounter: Payer: Self-pay | Admitting: Emergency Medicine

## 2021-01-29 ENCOUNTER — Other Ambulatory Visit: Payer: Self-pay

## 2021-01-29 ENCOUNTER — Emergency Department: Payer: Medicaid Other

## 2021-01-29 ENCOUNTER — Emergency Department
Admission: EM | Admit: 2021-01-29 | Discharge: 2021-01-29 | Disposition: A | Discharge status: Home / Self Care | Payer: Medicaid Other | Attending: Emergency Medicine | Admitting: Emergency Medicine

## 2021-01-29 DIAGNOSIS — R0781 Pleurodynia: Secondary | ICD-10-CM | POA: Insufficient documentation

## 2021-01-29 DIAGNOSIS — J0101 Acute recurrent maxillary sinusitis: Secondary | ICD-10-CM | POA: Diagnosis not present

## 2021-01-29 DIAGNOSIS — I10 Essential (primary) hypertension: Secondary | ICD-10-CM | POA: Insufficient documentation

## 2021-01-29 DIAGNOSIS — Z21 Asymptomatic human immunodeficiency virus [HIV] infection status: Secondary | ICD-10-CM | POA: Insufficient documentation

## 2021-01-29 DIAGNOSIS — Z7982 Long term (current) use of aspirin: Secondary | ICD-10-CM | POA: Diagnosis not present

## 2021-01-29 DIAGNOSIS — J40 Bronchitis, not specified as acute or chronic: Secondary | ICD-10-CM | POA: Insufficient documentation

## 2021-01-29 DIAGNOSIS — Z87891 Personal history of nicotine dependence: Secondary | ICD-10-CM | POA: Diagnosis not present

## 2021-01-29 DIAGNOSIS — R059 Cough, unspecified: Secondary | ICD-10-CM | POA: Diagnosis present

## 2021-01-29 DIAGNOSIS — Z79899 Other long term (current) drug therapy: Secondary | ICD-10-CM | POA: Insufficient documentation

## 2021-01-29 LAB — COMPREHENSIVE METABOLIC PANEL
ALT: 14 U/L (ref 0–44)
AST: 20 U/L (ref 15–41)
Albumin: 4 g/dL (ref 3.5–5.0)
Alkaline Phosphatase: 66 U/L (ref 38–126)
Anion gap: 4 — ABNORMAL LOW (ref 5–15)
BUN: 14 mg/dL (ref 6–20)
CO2: 26 mmol/L (ref 22–32)
Calcium: 8.7 mg/dL — ABNORMAL LOW (ref 8.9–10.3)
Chloride: 106 mmol/L (ref 98–111)
Creatinine, Ser: 1.02 mg/dL (ref 0.61–1.24)
GFR, Estimated: >60 mL/min (ref >60)
Glucose, Bld: 86 mg/dL (ref 70–99)
Potassium: 3.9 mmol/L (ref 3.5–5.1)
Sodium: 136 mmol/L (ref 135–145)
Total Bilirubin: 0.4 mg/dL (ref 0.3–1.2)
Total Protein: 7.4 g/dL (ref 6.5–8.1)

## 2021-01-29 LAB — CBC WITH DIFFERENTIAL/PLATELET
Abs Immature Granulocytes: 0.01 10*3/uL (ref 0.00–0.07)
Basophils Absolute: 0 10*3/uL (ref 0.0–0.1)
Basophils Relative: 1 %
Eosinophils Absolute: 0.1 10*3/uL (ref 0.0–0.5)
Eosinophils Relative: 2 %
HCT: 40.7 % (ref 39.0–52.0)
Hemoglobin: 13.5 g/dL (ref 13.0–17.0)
Immature Granulocytes: 0 %
Lymphocytes Relative: 53 %
Lymphs Abs: 2.7 10*3/uL (ref 0.7–4.0)
MCH: 28.9 pg (ref 26.0–34.0)
MCHC: 33.2 g/dL (ref 30.0–36.0)
MCV: 87.2 fL (ref 80.0–100.0)
Monocytes Absolute: 0.5 10*3/uL (ref 0.1–1.0)
Monocytes Relative: 10 %
Neutro Abs: 1.7 10*3/uL (ref 1.7–7.7)
Neutrophils Relative %: 34 %
Platelets: 244 10*3/uL (ref 150–400)
RBC: 4.67 MIL/uL (ref 4.22–5.81)
RDW: 12.9 % (ref 11.5–15.5)
WBC: 5 10*3/uL (ref 4.0–10.5)
nRBC: 0 % (ref 0.0–0.2)

## 2021-01-29 MED ORDER — PREDNISONE 20 MG PO TABS
60.0000 mg | ORAL_TABLET | Freq: Once | ORAL | Status: AC
  Administered 2021-01-29: 60 mg via ORAL
  Filled 2021-01-29: qty 3

## 2021-01-29 MED ORDER — CEFDINIR 300 MG PO CAPS
300.0000 mg | ORAL_CAPSULE | Freq: Once | ORAL | Status: AC
  Administered 2021-01-29: 300 mg via ORAL
  Filled 2021-01-29 (×2): qty 1

## 2021-01-29 MED ORDER — ALBUTEROL SULFATE HFA 108 (90 BASE) MCG/ACT IN AERS: 2.0000 | INHALATION_SPRAY | Freq: Four times a day (QID) | RESPIRATORY_TRACT | 2 refills | Status: DC | PRN

## 2021-01-29 MED ORDER — IOHEXOL 350 MG/ML SOLN
75.0000 mL | Freq: Once | INTRAVENOUS | Status: AC | PRN
  Administered 2021-01-29: 75 mL via INTRAVENOUS
  Filled 2021-01-29: qty 75

## 2021-01-29 MED ORDER — PREDNISONE 10 MG PO TABS: 10.0000 mg | ORAL_TABLET | ORAL | 0 refills | Status: DC

## 2021-01-29 MED ORDER — CEFDINIR 300 MG PO CAPS: 300.0000 mg | ORAL_CAPSULE | Freq: Two times a day (BID) | ORAL | 0 refills | Status: DC

## 2021-01-29 NOTE — ED Notes (Addendum)
ED provider at bedside.

## 2021-01-29 NOTE — ED Notes (Signed)
Patient transported to CT 

## 2021-01-29 NOTE — ED Notes (Signed)
Cefdinir requested from pharmacy.

## 2021-01-29 NOTE — ED Triage Notes (Signed)
Sinus congestion and left chest congestion.  AAOx3.  Skin warm and dry. NAD.  No SOB/ DOE

## 2021-01-29 NOTE — ED Notes (Signed)
Patient reports chest pain to the left chest x today. Patient also reports nasal congestion, and SOB. Patient is alert, oriented x4, with even and unlabored respirations at this time.
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
## 2021-03-12 ENCOUNTER — Other Ambulatory Visit: Payer: Self-pay

## 2021-03-12 ENCOUNTER — Emergency Department
Admission: EM | Admit: 2021-03-12 | Discharge: 2021-03-13 | Disposition: A | Discharge status: Home / Self Care | Payer: Medicaid Other | Attending: Emergency Medicine | Admitting: Emergency Medicine

## 2021-03-12 ENCOUNTER — Encounter: Payer: Self-pay | Admitting: Emergency Medicine

## 2021-03-12 DIAGNOSIS — J0111 Acute recurrent frontal sinusitis: Secondary | ICD-10-CM | POA: Insufficient documentation

## 2021-03-12 DIAGNOSIS — R0981 Nasal congestion: Secondary | ICD-10-CM | POA: Diagnosis present

## 2021-03-12 DIAGNOSIS — Z21 Asymptomatic human immunodeficiency virus [HIV] infection status: Secondary | ICD-10-CM | POA: Diagnosis not present

## 2021-03-12 DIAGNOSIS — Z7982 Long term (current) use of aspirin: Secondary | ICD-10-CM | POA: Insufficient documentation

## 2021-03-12 DIAGNOSIS — Z79899 Other long term (current) drug therapy: Secondary | ICD-10-CM | POA: Diagnosis not present

## 2021-03-12 DIAGNOSIS — I10 Essential (primary) hypertension: Secondary | ICD-10-CM | POA: Insufficient documentation

## 2021-03-12 DIAGNOSIS — Z87891 Personal history of nicotine dependence: Secondary | ICD-10-CM | POA: Diagnosis not present

## 2021-03-12 DIAGNOSIS — N3010 Interstitial cystitis (chronic) without hematuria: Secondary | ICD-10-CM | POA: Insufficient documentation

## 2021-03-12 LAB — URINALYSIS, COMPLETE (UACMP) WITH MICROSCOPIC
Bacteria, UA: NONE SEEN
Bilirubin Urine: NEGATIVE
Glucose, UA: NEGATIVE mg/dL
Hgb urine dipstick: NEGATIVE
Ketones, ur: NEGATIVE mg/dL
Leukocytes,Ua: NEGATIVE
Nitrite: NEGATIVE
Protein, ur: NEGATIVE mg/dL
Specific Gravity, Urine: 1.019 (ref 1.005–1.030)
pH: 5 (ref 5.0–8.0)

## 2021-03-12 MED ORDER — AZELASTINE HCL 0.1 % NA SOLN: 1.0000 | Freq: Two times a day (BID) | NASAL | 3 refills | Status: DC

## 2021-03-12 MED ORDER — SULFAMETHOXAZOLE-TRIMETHOPRIM 800-160 MG PO TABS
1.0000 | ORAL_TABLET | Freq: Once | ORAL | Status: AC
  Administered 2021-03-12: 1 via ORAL
  Filled 2021-03-12: qty 1

## 2021-03-12 MED ORDER — PHENAZOPYRIDINE HCL 100 MG PO TABS: 100.0000 mg | ORAL_TABLET | Freq: Three times a day (TID) | ORAL | 0 refills | Status: DC | PRN

## 2021-03-12 MED ORDER — SULFAMETHOXAZOLE-TRIMETHOPRIM 800-160 MG PO TABS: 1.0000 | ORAL_TABLET | Freq: Two times a day (BID) | ORAL | 0 refills | Status: AC

## 2021-03-12 MED ORDER — FEXOFENADINE HCL 180 MG PO TABS: 180.0000 mg | ORAL_TABLET | Freq: Every day | ORAL | 12 refills | Status: DC

## 2021-03-12 MED ORDER — MELOXICAM 15 MG PO TABS: 15.0000 mg | ORAL_TABLET | Freq: Every day | ORAL | 0 refills | Status: DC
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
## 2021-03-12 NOTE — ED Triage Notes (Signed)
Pt reports that they are having sinus pain, throat pain for about a week and a half. They are also complaining of UTI symptoms.

## 2021-07-02 ENCOUNTER — Other Ambulatory Visit: Payer: Self-pay

## 2021-07-02 ENCOUNTER — Emergency Department
Admission: EM | Admit: 2021-07-02 | Discharge: 2021-07-02 | Disposition: A | Discharge status: Home / Self Care | Payer: Medicaid Other | Attending: Emergency Medicine | Admitting: Emergency Medicine

## 2021-07-02 ENCOUNTER — Emergency Department: Payer: Medicaid Other

## 2021-07-02 DIAGNOSIS — Z5321 Procedure and treatment not carried out due to patient leaving prior to being seen by health care provider: Secondary | ICD-10-CM | POA: Insufficient documentation

## 2021-07-02 DIAGNOSIS — Z7982 Long term (current) use of aspirin: Secondary | ICD-10-CM | POA: Diagnosis not present

## 2021-07-02 DIAGNOSIS — Z87891 Personal history of nicotine dependence: Secondary | ICD-10-CM | POA: Insufficient documentation

## 2021-07-02 DIAGNOSIS — I10 Essential (primary) hypertension: Secondary | ICD-10-CM | POA: Diagnosis not present

## 2021-07-02 DIAGNOSIS — J0101 Acute recurrent maxillary sinusitis: Secondary | ICD-10-CM | POA: Diagnosis not present

## 2021-07-02 DIAGNOSIS — H9202 Otalgia, left ear: Secondary | ICD-10-CM | POA: Insufficient documentation

## 2021-07-02 DIAGNOSIS — Z21 Asymptomatic human immunodeficiency virus [HIV] infection status: Secondary | ICD-10-CM | POA: Diagnosis not present

## 2021-07-02 DIAGNOSIS — Z79899 Other long term (current) drug therapy: Secondary | ICD-10-CM | POA: Diagnosis not present

## 2021-07-02 DIAGNOSIS — R0981 Nasal congestion: Secondary | ICD-10-CM | POA: Diagnosis present

## 2021-07-02 LAB — BASIC METABOLIC PANEL
Anion gap: 3 — ABNORMAL LOW (ref 5–15)
BUN: 16 mg/dL (ref 6–20)
CO2: 27 mmol/L (ref 22–32)
Calcium: 8.5 mg/dL — ABNORMAL LOW (ref 8.9–10.3)
Chloride: 107 mmol/L (ref 98–111)
Creatinine, Ser: 0.98 mg/dL (ref 0.61–1.24)
GFR, Estimated: >60 mL/min (ref >60)
Glucose, Bld: 107 mg/dL — ABNORMAL HIGH (ref 70–99)
Potassium: 3.8 mmol/L (ref 3.5–5.1)
Sodium: 137 mmol/L (ref 135–145)

## 2021-07-02 LAB — CBC WITH DIFFERENTIAL/PLATELET
Abs Immature Granulocytes: 0.01 10*3/uL (ref 0.00–0.07)
Basophils Absolute: 0.1 10*3/uL (ref 0.0–0.1)
Basophils Relative: 1 %
Eosinophils Absolute: 0.2 10*3/uL (ref 0.0–0.5)
Eosinophils Relative: 2 %
HCT: 42.2 % (ref 39.0–52.0)
Hemoglobin: 13.4 g/dL (ref 13.0–17.0)
Immature Granulocytes: 0 %
Lymphocytes Relative: 55 %
Lymphs Abs: 3.5 10*3/uL (ref 0.7–4.0)
MCH: 28 pg (ref 26.0–34.0)
MCHC: 31.8 g/dL (ref 30.0–36.0)
MCV: 88.1 fL (ref 80.0–100.0)
Monocytes Absolute: 0.6 10*3/uL (ref 0.1–1.0)
Monocytes Relative: 10 %
Neutro Abs: 2 10*3/uL (ref 1.7–7.7)
Neutrophils Relative %: 32 %
Platelets: 273 10*3/uL (ref 150–400)
RBC: 4.79 MIL/uL (ref 4.22–5.81)
RDW: 12.8 % (ref 11.5–15.5)
WBC: 6.4 10*3/uL (ref 4.0–10.5)
nRBC: 0 % (ref 0.0–0.2)

## 2021-07-02 LAB — TROPONIN I (HIGH SENSITIVITY): Troponin I (High Sensitivity): 3 ng/L (ref <18)

## 2021-07-02 MED ORDER — AZITHROMYCIN 500 MG PO TABS
500.0000 mg | ORAL_TABLET | Freq: Once | ORAL | Status: DC
  Administered 2021-07-02: 500 mg via ORAL
  Filled 2021-07-02: qty 1

## 2021-07-02 MED ORDER — AZITHROMYCIN 250 MG PO TABS: ORAL_TABLET | ORAL | 0 refills | Status: AC

## 2021-07-02 MED ORDER — ERYTHROMYCIN 5 MG/GM OP OINT: 1.0000 "application " | TOPICAL_OINTMENT | Freq: Three times a day (TID) | OPHTHALMIC | 0 refills | Status: DC

## 2021-07-02 NOTE — ED Provider Notes (Signed)
Emergency Medicine Provider Triage Evaluation Note  Jonathan Deleon , a 50 y.o. adult  was evaluated in triage.  Pt complains of nasal pressure and drainage for the past 3 weeks.  She states she has now developed sharp pains moving down in her chest with a productive cough, denies any fevers..  Review of Systems  Positive: Cough, chest pain, sinus pressure, nasal drainage. Negative: Fever, shortness of breath.  Physical Exam  BP 125/78 (BP Location: Left Arm)   Pulse 80   Temp 98.6 F (37 C) (Oral)   Resp 18   Ht 5\' 9"  (1.753 m)   Wt 72.6 kg   SpO2 100%   BMI 23.63 kg/m  Gen:   Awake, no distress  Resp:  Normal effort MSK:   Moves extremities without difficulty Other:  2+ radial pulses bilaterally.  Medical Decision Making  Medically screening exam initiated at 7:43 PM.  Appropriate orders placed.  Jonathan A Holness was informed that the remainder of the evaluation will be completed by another provider, this initial triage assessment does not replace that evaluation, and the importance of remaining in the ED until their evaluation is complete.    [place], MD 07/02/21 1944

## 2021-07-02 NOTE — ED Triage Notes (Signed)
Pt states that she has had chest pain and congestion x3 weeks

## 2021-07-02 NOTE — ED Provider Notes (Signed)
[facility] Emergency Department Provider Note   ____________________________________________    I have reviewed the triage vital signs and the nursing notes.   HISTORY  Chief Complaint Nasal congestion     HPI Jonathan Deleon is a 50 y.o. adult with history as noted below who presents with complaints of mild nasal congestion and pressure.  Reports frequent episodes of sinusitis also reports some redness of left eye, normal vision  Past Medical History:  Diagnosis Date   HIV (human immunodeficiency virus infection) (HCC)    Hypertension    Seizures (HCC)    Stroke [facility])     Patient Active Problem List   Diagnosis Date Noted   Seizure (HCC) 03/18/2016    Past Surgical History:  Procedure Laterality Date   BRAIN SURGERY     COSMETIC SURGERY      Prior to Admission medications   Medication Sig Start Date End Date Taking? Authorizing Provider  azithromycin (ZITHROMAX Z-PAK) 250 MG tablet Take 2 tablets (500 mg) on  Day 1,  followed by 1 tablet (250 mg) once daily on Days 2 through 5. 07/02/21 07/07/21 Yes Jene Every, MD  erythromycin ophthalmic ointment Place 1 application into the left eye 3 (three) times daily. 07/02/21  Yes Jene Every, MD  albuterol (VENTOLIN HFA) 108 (90 Base) MCG/ACT inhaler Inhale 2 puffs into the lungs every 6 (six) hours as needed for wheezing or shortness of breath. 01/29/21   Cuthriell, Delorise Royals, PA-C  aspirin 325 MG tablet Take 325 mg by mouth daily.    [provider]  azelastine (ASTELIN) 0.1 % nasal spray Place 1 spray into both nostrils 2 (two) times daily. Use in each nostril as directed 03/12/21   Cuthriell, Delorise Royals, PA-C  benzonatate (TESSALON PERLES) 100 MG capsule Take 1 capsule (100 mg total) by mouth every 6 (six) hours as needed. 10/31/20 10/31/21  Cuthriell, Delorise Royals, PA-C  brompheniramine-pseudoephedrine-DM 30-2-10 MG/5ML syrup Take 10 mLs by mouth 4 (four) times daily as needed.  10/31/20   Cuthriell, Delorise Royals, PA-C  cefdinir (OMNICEF) 300 MG capsule Take 1 capsule (300 mg total) by mouth 2 (two) times daily. 01/29/21   Cuthriell, Delorise Royals, PA-C  cyclobenzaprine (FLEXERIL) 5 MG tablet Take 1 tablet (5 mg total) by mouth 3 (three) times daily as needed. 12/28/19   Menshew, Charlesetta Ivory, PA-C  Dolutegravir-lamiVUDine 50-300 MG TABS Take 1 tablet by mouth daily.    [provider]  efavirenz-emtricitabine-tenofovir (ATRIPLA) 600-200-300 MG per tablet Take 1 tablet by mouth at bedtime.    [provider]  estradiol (ESTRACE) 1 MG tablet Take 4 mg by mouth daily.    [provider]  fexofenadine (ALLEGRA ALLERGY) 180 MG tablet Take 1 tablet (180 mg total) by mouth daily. 03/12/21   Cuthriell, Delorise Royals, PA-C  fluconazole (DIFLUCAN) 150 MG tablet Take one now and one in a week 05/11/20   Sherrie Mustache, Roselyn Bering, PA-C  fluticasone [facility]) 50 MCG/ACT nasal spray Place 2 sprays into both nostrils daily. 10/25/19   Menshew, Charlesetta Ivory, PA-C  levETIRAcetam (KEPPRA) 500 MG tablet Take 1 tablet (500 mg total) by mouth 2 (two) times daily. 01/30/18 10/11/18  Willy Eddy, MD  lidocaine (XYLOCAINE) 2 % solution Use as directed 10 mLs in the mouth or throat every 4 (four) hours as needed for mouth pain. gargle, and spit 10/31/20   Cuthriell, Delorise Royals, PA-C  meloxicam (MOBIC) 15 MG tablet Take 1 tablet (15 mg  total) by mouth daily. 03/12/21   Cuthriell, Delorise Royals, PA-C  phenazopyridine (PYRIDIUM) 100 MG tablet Take 1 tablet (100 mg total) by mouth 3 (three) times daily as needed for pain. 03/12/21   Cuthriell, Delorise Royals, PA-C  Phenylephrine-Acetaminophen 5-500 MG TABS Take 1 tablet by mouth every 8 (eight) hours as needed (nasal congestion). 08/02/20   Merwyn Katos, MD  predniSONE (DELTASONE) 10 MG tablet Take 1 tablet (10 mg total) by mouth as directed. 01/29/21   Cuthriell, Delorise Royals, PA-C  spironolactone (ALDACTONE) 100 MG tablet Take 100 mg by mouth every  morning. 09/14/14   [provider]  tetrahydrozoline-zinc (VISINE-AC) 0.05-0.25 % ophthalmic solution Place 2 drops into the left eye 3 (three) times daily as needed. 01/18/19   Enid Derry, PA-C  traMADol (ULTRAM) 50 MG tablet Take 1 tablet (50 mg total) by mouth every 6 (six) hours as needed. 05/11/20   Faythe Ghee, PA-C     Allergies Shellfish allergy and Penicillins  No family history on file.  Social History Social History   Tobacco Use   Smoking status: Never   Smokeless tobacco: Former  Building services engineer Use: Never used  Substance Use Topics   Alcohol use: Yes    Comment: occas.    Drug use: No    Review of Systems  Constitutional: No fever/chills  ENT: As above   Gastrointestinal: No abdominal pain.  No nausea, no vomiting.    Skin: Negative for rash. Neurological: Negative for headaches     ____________________________________________   PHYSICAL EXAM:  VITAL SIGNS: ED Triage Vitals  Enc Vitals Group     BP 07/02/21 1933 125/78     Pulse Rate 07/02/21 1933 80     Resp 07/02/21 1933 18     Temp 07/02/21 1933 98.6 F (37 C)     Temp Source 07/02/21 1933 Oral     SpO2 07/02/21 1933 100 %     Weight 07/02/21 1933 72.6 kg (160 lb)     Height 07/02/21 1933 1.753 m (5\' 9" )     Head Circumference --      Peak Flow --      Pain Score 07/02/21 1938 10     Pain Loc --      Pain Edu? --      Excl. in GC? --      Constitutional: Alert and oriented. No acute distress. Pleasant and interactive Eyes: Conjunctivae are normal.  Left eye: PERRLA, EOMI, Head: Atraumatic. Nose: No congestion/rhinnorhea.  Mild tenderness over the maxillary sinuses Mouth/Throat: Mucous membranes are moist.   Cardiovascular: Normal rate, regular rhythm.  Respiratory: Normal respiratory effort.  No retractions.  Musculoskeletal: No lower extremity tenderness nor edema.   Neurologic:  Normal speech and language. No gross focal neurologic deficits are appreciated.    Skin:  Skin is warm, dry and intact. No rash noted.   ____________________________________________   LABS (all labs ordered are listed, but only abnormal results are displayed)  Labs Reviewed  BASIC METABOLIC PANEL - Abnormal; Notable for the following components:      Result Value   Glucose, Bld 107 (*)    Calcium 8.5 (*)    Anion gap 3 (*)    All other components within normal limits  CBC WITH DIFFERENTIAL/PLATELET  TROPONIN I (HIGH SENSITIVITY)  TROPONIN I (HIGH SENSITIVITY)   ____________________________________________  EKG ED ECG REPORT I, 07/04/21, the attending physician, personally viewed and interpreted this ECG.  Date: 07/02/2021  Rhythm: normal sinus rhythm QRS Axis: normal Intervals: normal ST/T Wave abnormalities: normal Narrative Interpretation: no evidence of acute ischemia   ____________________________________________  RADIOLOGY  Chest x-ray reviewed by me, no acute abnormality ____________________________________________   PROCEDURES  Procedure(s) performed: No  Procedures   Critical Care performed: No ____________________________________________   INITIAL IMPRESSION / ASSESSMENT AND PLAN / ED COURSE  Pertinent labs & imaging results that were available during my care of the patient were reviewed by me and considered in my medical decision making (see chart for details).   Patient well-appearing and in no acute distress, no complaints of chest pain for me, lab work is overall reassuring and with normal troponin.  Primary complaint is sinusitis today.  Patient is aggressive and threatening towards me for reasons that are not clear   Treated appropriately and recommend outpatient discharge   ____________________________________________   FINAL CLINICAL IMPRESSION(S) / ED DIAGNOSES  Final diagnoses:  Acute recurrent maxillary sinusitis      NEW MEDICATIONS STARTED DURING THIS VISIT:  Discharge Medication List as of  07/02/2021  8:49 PM     START taking these medications   Details  azithromycin (ZITHROMAX Z-PAK) 250 MG tablet Take 2 tablets (500 mg) on  Day 1,  followed by 1 tablet (250 mg) once daily on Days 2 through 5., Normal         Note:  This document was prepared using Dragon voice recognition software and may include unintentional dictation errors.    Jene Every, MD 07/02/21 2211

## 2021-07-03 ENCOUNTER — Emergency Department
Admission: EM | Admit: 2021-07-03 | Discharge: 2021-07-03 | Disposition: A | Discharge status: Home / Self Care | Payer: Medicaid Other | Source: Home / Self Care

## 2021-07-03 ENCOUNTER — Other Ambulatory Visit: Payer: Self-pay

## 2021-07-03 NOTE — ED Notes (Signed)
No answer when called several times from lobby 

## 2021-07-03 NOTE — ED Triage Notes (Signed)
Pt states he is having irrigation to left eye, was seen here earlier today for sinus infection. Pt was put on zithromax for the same, here for worsening pain to left eye. Pt states she has had drainage from left eye.

## 2021-07-28 ENCOUNTER — Other Ambulatory Visit: Payer: Self-pay

## 2021-07-28 ENCOUNTER — Emergency Department
Admission: EM | Admit: 2021-07-28 | Discharge: 2021-07-28 | Disposition: A | Discharge status: Home / Self Care | Payer: Medicaid Other | Attending: Emergency Medicine | Admitting: Emergency Medicine

## 2021-07-28 DIAGNOSIS — I10 Essential (primary) hypertension: Secondary | ICD-10-CM | POA: Insufficient documentation

## 2021-07-28 DIAGNOSIS — Z79899 Other long term (current) drug therapy: Secondary | ICD-10-CM | POA: Insufficient documentation

## 2021-07-28 DIAGNOSIS — Z87891 Personal history of nicotine dependence: Secondary | ICD-10-CM | POA: Diagnosis not present

## 2021-07-28 DIAGNOSIS — J40 Bronchitis, not specified as acute or chronic: Secondary | ICD-10-CM | POA: Insufficient documentation

## 2021-07-28 DIAGNOSIS — Z7982 Long term (current) use of aspirin: Secondary | ICD-10-CM | POA: Insufficient documentation

## 2021-07-28 DIAGNOSIS — R059 Cough, unspecified: Secondary | ICD-10-CM | POA: Diagnosis present

## 2021-07-28 DIAGNOSIS — Z21 Asymptomatic human immunodeficiency virus [HIV] infection status: Secondary | ICD-10-CM | POA: Insufficient documentation

## 2021-07-28 MED ORDER — PREDNISONE 20 MG PO TABS: 60.0000 mg | ORAL_TABLET | Freq: Once | ORAL | Status: DC

## 2021-07-28 MED ORDER — PREDNISONE 20 MG PO TABS
60.0000 mg | ORAL_TABLET | Freq: Once | ORAL | Status: AC
  Administered 2021-07-28: 21:00:00 60 mg via ORAL

## 2021-07-28 MED ORDER — ALBUTEROL SULFATE (2.5 MG/3ML) 0.083% IN NEBU
2.5000 mg | INHALATION_SOLUTION | Freq: Once | RESPIRATORY_TRACT | Status: AC
  Administered 2021-07-28: 21:00:00 2.5 mg via RESPIRATORY_TRACT
  Filled 2021-07-28: qty 3

## 2021-07-28 MED ORDER — PREDNISONE 50 MG PO TABS: 50.0000 mg | ORAL_TABLET | Freq: Every day | ORAL | 0 refills | Status: DC

## 2021-07-28 MED ORDER — ALBUTEROL SULFATE HFA 108 (90 BASE) MCG/ACT IN AERS: 2.0000 | INHALATION_SPRAY | RESPIRATORY_TRACT | 0 refills | Status: AC | PRN

## 2021-07-28 MED ORDER — BENZONATATE 100 MG PO CAPS: 100.0000 mg | ORAL_CAPSULE | Freq: Three times a day (TID) | ORAL | 0 refills | Status: AC | PRN

## 2021-07-28 MED ORDER — METHYLPREDNISOLONE SODIUM SUCC 125 MG IJ SOLR
125.0000 mg | Freq: Once | INTRAMUSCULAR | Status: AC
  Administered 2021-07-28: 21:00:00 125 mg via INTRAMUSCULAR
  Filled 2021-07-28: qty 2

## 2021-07-28 MED ORDER — PSEUDOEPH-BROMPHEN-DM 30-2-10 MG/5ML PO SYRP: 10.0000 mL | ORAL_SOLUTION | Freq: Four times a day (QID) | ORAL | 0 refills | Status: DC | PRN

## 2021-07-28 NOTE — ED Triage Notes (Signed)
Pt states that they are congested and their sinuses are bothering them for the past couple weeks

## 2021-07-28 NOTE — ED Provider Notes (Signed)
[facility] Emergency Department Provider Note  ____________________________________________  Time seen: Approximately 8:42 PM  I have reviewed the triage vital signs and the nursing notes.   HISTORY  Chief Complaint Nasal Congestion    HPI Jonathan Deleon is a 50 y.o. adult who presents to the emergency department complaining of congestion, cough.  Patient believes that she may have bronchitis.  Patient endorses no chest pain, increased work of breathing.  Congestion and postnasal drip.  No sore throat.  No GI symptoms.  Patient does have a history of HIV, has been treated in the past for pneumocystis jirovici.  Symptoms have not lasted about a few days.  Patient does take allergy medication but has not been using her nasal spray.  No other meds for symptoms prior to arrival.       Past Medical History:  Diagnosis Date   HIV (human immunodeficiency virus infection) (HCC)    Hypertension    Seizures (HCC)    Stroke [facility])     Patient Active Problem List   Diagnosis Date Noted   Seizure (HCC) 03/18/2016    Past Surgical History:  Procedure Laterality Date   BRAIN SURGERY     COSMETIC SURGERY      Prior to Admission medications   Medication Sig Start Date End Date Taking? Authorizing Provider  albuterol (VENTOLIN HFA) 108 (90 Base) MCG/ACT inhaler Inhale 2 puffs into the lungs every 4 (four) hours as needed for wheezing or shortness of breath. 07/28/21  Yes Naijah Lacek, Delorise Royals, PA-C  benzonatate (TESSALON PERLES) 100 MG capsule Take 1 capsule (100 mg total) by mouth 3 (three) times daily as needed for cough. 07/28/21 07/28/22 Yes Heaton Sarin, Delorise Royals, PA-C  brompheniramine-pseudoephedrine-DM 30-2-10 MG/5ML syrup Take 10 mLs by mouth 4 (four) times daily as needed. 07/28/21  Yes Cindel Daugherty, Delorise Royals, PA-C  predniSONE (DELTASONE) 50 MG tablet Take 1 tablet (50 mg total) by mouth daily with breakfast. 07/28/21  Yes Kaiden Dardis, Delorise Royals, PA-C   aspirin 325 MG tablet Take 325 mg by mouth daily.    [provider]  azelastine (ASTELIN) 0.1 % nasal spray Place 1 spray into both nostrils 2 (two) times daily. Use in each nostril as directed 03/12/21   Maryah Marinaro, Delorise Royals, PA-C  cefdinir (OMNICEF) 300 MG capsule Take 1 capsule (300 mg total) by mouth 2 (two) times daily. 01/29/21   Meggin Ola, Delorise Royals, PA-C  cyclobenzaprine (FLEXERIL) 5 MG tablet Take 1 tablet (5 mg total) by mouth 3 (three) times daily as needed. 12/28/19   Menshew, Charlesetta Ivory, PA-C  Dolutegravir-lamiVUDine 50-300 MG TABS Take 1 tablet by mouth daily.    [provider]  efavirenz-emtricitabine-tenofovir (ATRIPLA) 600-200-300 MG per tablet Take 1 tablet by mouth at bedtime.    [provider]  erythromycin ophthalmic ointment Place 1 application into the left eye 3 (three) times daily. 07/02/21   Jene Every, MD  estradiol (ESTRACE) 1 MG tablet Take 4 mg by mouth daily.    [provider]  fexofenadine (ALLEGRA ALLERGY) 180 MG tablet Take 1 tablet (180 mg total) by mouth daily. 03/12/21   Zyheir Daft, Delorise Royals, PA-C  fluconazole (DIFLUCAN) 150 MG tablet Take one now and one in a week 05/11/20   Sherrie Mustache, Roselyn Bering, PA-C  fluticasone [facility]) 50 MCG/ACT nasal spray Place 2 sprays into both nostrils daily. 10/25/19   Menshew, Charlesetta Ivory, PA-C  levETIRAcetam (KEPPRA) 500 MG tablet Take 1 tablet (500 mg total) by mouth 2 (  two) times daily. 01/30/18 10/11/18  Willy Eddy, MD  lidocaine (XYLOCAINE) 2 % solution Use as directed 10 mLs in the mouth or throat every 4 (four) hours as needed for mouth pain. gargle, and spit 10/31/20   Rori Goar, Delorise Royals, PA-C  meloxicam (MOBIC) 15 MG tablet Take 1 tablet (15 mg total) by mouth daily. 03/12/21   Taisha Pennebaker, Delorise Royals, PA-C  phenazopyridine (PYRIDIUM) 100 MG tablet Take 1 tablet (100 mg total) by mouth 3 (three) times daily as needed for pain. 03/12/21   Naileah Karg, Delorise Royals, PA-C   Phenylephrine-Acetaminophen 5-500 MG TABS Take 1 tablet by mouth every 8 (eight) hours as needed (nasal congestion). 08/02/20   Merwyn Katos, MD  spironolactone (ALDACTONE) 100 MG tablet Take 100 mg by mouth every morning. 09/14/14   [provider]  tetrahydrozoline-zinc (VISINE-AC) 0.05-0.25 % ophthalmic solution Place 2 drops into the left eye 3 (three) times daily as needed. 01/18/19   Enid Derry, PA-C  traMADol (ULTRAM) 50 MG tablet Take 1 tablet (50 mg total) by mouth every 6 (six) hours as needed. 05/11/20   Faythe Ghee, PA-C    Allergies Shellfish allergy and Penicillins  No family history on file.  Social History Social History   Tobacco Use   Smoking status: Never   Smokeless tobacco: Former  Building services engineer Use: Never used  Substance Use Topics   Alcohol use: Yes    Comment: occas.    Drug use: No     Review of Systems  Constitutional: No fever/chills Eyes: No visual changes. No discharge ENT: Nasal congestion Cardiovascular: no chest pain. Respiratory: Positive cough. No SOB. Gastrointestinal: No abdominal pain.  No nausea, no vomiting.  No diarrhea.  No constipation. Musculoskeletal: Negative for musculoskeletal pain. Skin: Negative for rash, abrasions, lacerations, ecchymosis. Neurological: Negative for headaches, focal weakness or numbness.  10 System ROS otherwise negative.  ____________________________________________   PHYSICAL EXAM:  VITAL SIGNS: ED Triage Vitals  Enc Vitals Group     BP 07/28/21 1830 (!) 152/93     Pulse Rate 07/28/21 1830 77     Resp 07/28/21 1830 18     Temp 07/28/21 1830 98.4 F (36.9 C)     Temp Source 07/28/21 1830 Oral     SpO2 07/28/21 1830 99 %     Weight 07/28/21 1830 160 lb (72.6 kg)     Height 07/28/21 1830 5\' 9"  (1.753 m)     Head Circumference --      Peak Flow --      Pain Score 07/28/21 1829 6     Pain Loc --      Pain Edu? --      Excl. in GC? --      Constitutional: Alert and  oriented. Well appearing and in no acute distress. Eyes: Conjunctivae are normal. PERRL. EOMI. Head: Atraumatic. ENT:      Ears:       Nose: Mild congestion/rhinnorhea.      Mouth/Throat: Mucous membranes are moist.  Neck: No stridor.  Neck is supple with full range of motion.  Cardiovascular: Normal rate, regular rhythm. Normal S1 and S2.  Good peripheral circulation. Respiratory: Normal respiratory effort without tachypnea or retractions. Lungs some scattered expiratory wheezing.  No inspiratory wheezing.  No rales or rhonchi.07/30/21 air entry to the bases with no decreased or absent breath sounds. Musculoskeletal: Full range of motion to all extremities. No gross deformities appreciated. Neurologic:  Normal speech and language. No gross  focal neurologic deficits are appreciated.  Skin:  Skin is warm, dry and intact. No rash noted. Psychiatric: Mood and affect are normal. Speech and behavior are normal. Patient exhibits appropriate insight and judgement.   ____________________________________________   LABS (all labs ordered are listed, but only abnormal results are displayed)  Labs Reviewed - No data to display ____________________________________________  EKG   ____________________________________________  RADIOLOGY  No results found.  ____________________________________________    PROCEDURES  Procedure(s) performed:    Procedures    Medications  albuterol (PROVENTIL) (2.5 MG/3ML) 0.083% nebulizer solution 2.5 mg (has no administration in time range)  methylPREDNISolone sodium succinate (SOLU-MEDROL) 125 mg/2 mL injection 125 mg (has no administration in time range)     ____________________________________________   INITIAL IMPRESSION / ASSESSMENT AND PLAN / ED COURSE  Pertinent labs & imaging results that were available during my care of the patient were reviewed by me and considered in my medical decision making (see chart for details).  Review of the  [place] was performed in accordance of the [facility] prior to dispensing any controlled drugs.           Patient's diagnosis is consistent with viral URI with bronchitis.  Patient presents with nasal congestion, postnasal drip and cough.  Symptoms x2 to 3 days.  Denies any chance of COVID or flu tested.  Patient will be placed on symptom control medications for nasal congestion postnasal drip and treated for bronchitis that she does have some expiratory wheezing.  Patient will be given albuterol and Solu-Medrol here in the emergency department.  Follow-up with primary care as needed.  Return precautions discussed.. Patient is given ED precautions to return to the ED for any worsening or new symptoms.     ____________________________________________  FINAL CLINICAL IMPRESSION(S) / ED DIAGNOSES  Final diagnoses:  Bronchitis      NEW MEDICATIONS STARTED DURING THIS VISIT:  ED Discharge Orders          Ordered    predniSONE (DELTASONE) 50 MG tablet  Daily with breakfast        07/28/21 2047    albuterol (VENTOLIN HFA) 108 (90 Base) MCG/ACT inhaler  Every 4 hours PRN        07/28/21 2047    brompheniramine-pseudoephedrine-DM 30-2-10 MG/5ML syrup  4 times daily PRN        07/28/21 2047    benzonatate (TESSALON PERLES) 100 MG capsule  3 times daily PRN        07/28/21 2047                This chart was dictated using voice recognition software/Dragon. Despite best efforts to proofread, errors can occur which can change the meaning. Any change was purely unintentional.    Racheal Patches, PA-C 07/28/21 2048    Chesley Noon, MD 07/28/21 [phone]

## 2021-08-22 ENCOUNTER — Emergency Department: Payer: Medicaid Other

## 2021-08-22 ENCOUNTER — Other Ambulatory Visit: Payer: Self-pay

## 2021-08-22 ENCOUNTER — Emergency Department
Admission: EM | Admit: 2021-08-22 | Discharge: 2021-08-22 | Disposition: A | Discharge status: Home / Self Care | Payer: Medicaid Other | Attending: Emergency Medicine | Admitting: Emergency Medicine

## 2021-08-22 ENCOUNTER — Encounter: Payer: Self-pay | Admitting: Emergency Medicine

## 2021-08-22 DIAGNOSIS — W57XXXA Bitten or stung by nonvenomous insect and other nonvenomous arthropods, initial encounter: Secondary | ICD-10-CM

## 2021-08-22 DIAGNOSIS — L03116 Cellulitis of left lower limb: Secondary | ICD-10-CM | POA: Insufficient documentation

## 2021-08-22 DIAGNOSIS — S80862A Insect bite (nonvenomous), left lower leg, initial encounter: Secondary | ICD-10-CM

## 2021-08-22 DIAGNOSIS — L02416 Cutaneous abscess of left lower limb: Secondary | ICD-10-CM | POA: Diagnosis present

## 2021-08-22 DIAGNOSIS — R2242 Localized swelling, mass and lump, left lower limb: Secondary | ICD-10-CM | POA: Diagnosis not present

## 2021-08-22 LAB — CBC WITH DIFFERENTIAL/PLATELET
Abs Immature Granulocytes: 0 10*3/uL (ref 0.00–0.07)
Basophils Absolute: 0 10*3/uL (ref 0.0–0.1)
Basophils Relative: 1 %
Eosinophils Absolute: 0.1 10*3/uL (ref 0.0–0.5)
Eosinophils Relative: 2 %
HCT: 42.6 % (ref 39.0–52.0)
Hemoglobin: 13.6 g/dL (ref 13.0–17.0)
Immature Granulocytes: 0 %
Lymphocytes Relative: 51 %
Lymphs Abs: 2.3 10*3/uL (ref 0.7–4.0)
MCH: 27.4 pg (ref 26.0–34.0)
MCHC: 31.9 g/dL (ref 30.0–36.0)
MCV: 85.9 fL (ref 80.0–100.0)
Monocytes Absolute: 0.4 10*3/uL (ref 0.1–1.0)
Monocytes Relative: 9 %
Neutro Abs: 1.7 10*3/uL (ref 1.7–7.7)
Neutrophils Relative %: 37 %
Platelets: 275 10*3/uL (ref 150–400)
RBC: 4.96 MIL/uL (ref 4.22–5.81)
RDW: 13.2 % (ref 11.5–15.5)
WBC: 4.5 10*3/uL (ref 4.0–10.5)
nRBC: 0 % (ref 0.0–0.2)

## 2021-08-22 LAB — BASIC METABOLIC PANEL
Anion gap: 7 (ref 5–15)
BUN: 12 mg/dL (ref 6–20)
CO2: 25 mmol/L (ref 22–32)
Calcium: 9 mg/dL (ref 8.9–10.3)
Chloride: 106 mmol/L (ref 98–111)
Creatinine, Ser: 1.07 mg/dL (ref 0.61–1.24)
GFR, Estimated: >60 mL/min (ref >60)
Glucose, Bld: 101 mg/dL — ABNORMAL HIGH (ref 70–99)
Potassium: 3.9 mmol/L (ref 3.5–5.1)
Sodium: 138 mmol/L (ref 135–145)

## 2021-08-22 LAB — PROTIME-INR
INR: 1 (ref 0.8–1.2)
Prothrombin Time: 13.3 seconds (ref 11.4–15.2)

## 2021-08-22 MED ORDER — SULFAMETHOXAZOLE-TRIMETHOPRIM 800-160 MG PO TABS: 1.0000 | ORAL_TABLET | Freq: Two times a day (BID) | ORAL | 0 refills | Status: DC

## 2021-08-22 MED ORDER — MELOXICAM 15 MG PO TABS: 15.0000 mg | ORAL_TABLET | Freq: Every day | ORAL | 0 refills | Status: DC

## 2021-08-22 MED ORDER — MELOXICAM 7.5 MG PO TABS
15.0000 mg | ORAL_TABLET | Freq: Once | ORAL | Status: AC
  Administered 2021-08-22: 15 mg via ORAL
  Filled 2021-08-22: qty 2

## 2021-08-22 MED ORDER — SULFAMETHOXAZOLE-TRIMETHOPRIM 800-160 MG PO TABS
1.0000 | ORAL_TABLET | Freq: Once | ORAL | Status: AC
  Administered 2021-08-22: 1 via ORAL
  Filled 2021-08-22: qty 1

## 2021-08-22 NOTE — ED Notes (Signed)
Pt declined discharge vitals at this time 

## 2021-08-22 NOTE — ED Provider Notes (Signed)
[facility] Provider Note  Patient Contact: 11:02 PM (approximate)   History   Abscess   HPI  Jonathan Deleon is a 51 y.o. adult who presents the emergency department complaining of left posterior knee pain.  Symptoms have began 2 days ago.  Patient believes that she may have been bitten by an insect to the left leg 4 days ago.  Pain seems to be localized specifically to the posterior knee.  No numbness or tingling.  No back pain.  No fevers or chills.  Patient believes that she may have an infection secondary to the insect bite which is causing her pain.     Physical Exam   Triage Vital Signs: ED Triage Vitals  Enc Vitals Group     BP 08/22/21 2135 130/79     Pulse Rate 08/22/21 2135 86     Resp 08/22/21 2135 16     Temp 08/22/21 2135 97.8 F (36.6 C)     Temp Source 08/22/21 2135 Oral     SpO2 --      Weight --      Height 08/22/21 2136 5\' 9"  (1.753 m)     Head Circumference --      Peak Flow --      Pain Score 08/22/21 2136 10     Pain Loc --      Pain Edu? --      Excl. in GC? --     Most recent vital signs: Vitals:   08/22/21 2135  BP: 130/79  Pulse: 86  Resp: 16  Temp: 97.8 F (36.6 C)     General: Alert and in no acute distress.  Cardiovascular:  Good peripheral perfusion Respiratory: Normal respiratory effort without tachypnea or retractions. Lungs CTAB.  Musculoskeletal: Full range of motion to all extremities.  Visualization of the left lower extremity reveals no gross erythema, edema.  There is a slight area of erythema and edema to the posterior knee.  This is in the popliteal fossa.  No palpable underlying abnormality to include firmness, or fluctuance.  Examination of the thigh, calf is unremarkable.  Dorsalis pedis pulses sensation intact distally. Neurologic:  No gross focal neurologic deficits are appreciated.  Skin:   No rash noted Other:   ED Results / Procedures / Treatments   Labs (all labs ordered are  listed, but only abnormal results are displayed) Labs Reviewed  BASIC METABOLIC PANEL - Abnormal; Notable for the following components:      Result Value   Glucose, Bld 101 (*)    All other components within normal limits  CBC WITH DIFFERENTIAL/PLATELET  PROTIME-INR     EKG     RADIOLOGY  I personally viewed and evaluated these images as part of my medical decision making, as well as reviewing the written report by the radiologist.  ED Provider Interpretation: No evidence of DVT on ultrasound  08/24/21 Venous Img Lower Unilateral Left  Result Date: 08/22/2021 CLINICAL DATA:  Left calf pain, initial encounter EXAM: LEFT LOWER EXTREMITY VENOUS DOPPLER ULTRASOUND TECHNIQUE: Gray-scale sonography with graded compression, as well as color Doppler and duplex ultrasound were performed to evaluate the lower extremity deep venous systems from the level of the common femoral vein and including the common femoral, femoral, profunda femoral, popliteal and calf veins including the posterior tibial, peroneal and gastrocnemius veins when visible. The superficial great saphenous vein was also interrogated. Spectral Doppler was utilized to evaluate flow at rest and with distal augmentation maneuvers  in the common femoral, femoral and popliteal veins. COMPARISON:  None. FINDINGS: Contralateral Common Femoral Vein: Respiratory phasicity is normal and symmetric with the symptomatic side. No evidence of thrombus. Normal compressibility. Common Femoral Vein: No evidence of thrombus. Normal compressibility, respiratory phasicity and response to augmentation. Saphenofemoral Junction: No evidence of thrombus. Normal compressibility and flow on color Doppler imaging. Profunda Femoral Vein: No evidence of thrombus. Normal compressibility and flow on color Doppler imaging. Femoral Vein: No evidence of thrombus. Normal compressibility, respiratory phasicity and response to augmentation. Popliteal Vein: No evidence of thrombus.  Normal compressibility, respiratory phasicity and response to augmentation. Calf Veins: No evidence of thrombus. Normal compressibility and flow on color Doppler imaging. Superficial Great Saphenous Vein: No evidence of thrombus. Normal compressibility. Venous Reflux:  None. Other Findings:  None. IMPRESSION: No evidence of deep venous thrombosis. Electronically Signed   By: Alcide Clever M.D.   On: 08/22/2021 22:42    PROCEDURES:  Critical Care performed: No  Procedures   MEDICATIONS ORDERED IN ED: Medications  sulfamethoxazole-trimethoprim (BACTRIM DS) 800-160 MG per tablet 1 tablet (1 tablet Oral Given 08/22/21 2314)  meloxicam (MOBIC) tablet 15 mg (15 mg Oral Given 08/22/21 2314)     IMPRESSION / MDM / ASSESSMENT AND PLAN / ED COURSE  I reviewed the triage vital signs and the nursing notes.                              Differential diagnosis includes, but is not limited to, DVT, cellulitis, tendinitis, knee strain, knee ligament tear   Patient's diagnosis is consistent with insect bite, cellulitis of the left knee.  Patient presented to the emergency department after being bitten by an unknown bug to the posterior left knee.  There is a very minimal area of erythema and edema in the posterior popliteal fossa.  Ultrasound revealed no evidence of DVT or Baker's cyst.  Patient's labs are reassuring with no elevation of white blood cell count and BMP and PT/INR reassuring.  This time patient will be treated symptomatically with an anti-inflammatory for pain relief and antibiotic for control of mild cellulitis.  Return precautions discussed with the patient.  Otherwise follow-up primary care as needed..  Patient is given ED precautions to return to the ED for any worsening or new symptoms.        FINAL CLINICAL IMPRESSION(S) / ED DIAGNOSES   Final diagnoses:  Insect bite of left lower extremity, initial encounter  Cellulitis of left lower extremity     Rx / DC Orders   ED  Discharge Orders          Ordered    meloxicam (MOBIC) 15 MG tablet  Daily        08/22/21 2305    sulfamethoxazole-trimethoprim (BACTRIM DS) 800-160 MG tablet  2 times daily        08/22/21 2305             Note:  This document was prepared using Dragon voice recognition software and may include unintentional dictation errors.   Lanette Hampshire 08/23/21 0016    Minna Antis, MD 08/23/21 2158

## 2021-08-22 NOTE — ED Triage Notes (Signed)
Pt to ED via POV with c/o what pt thinks could be a spider bight on the back of their left leg. There is knot on the back of pts left leg. Pt takes hormone pills. Pt is having pain in the back of his calf

## 2021-11-06 ENCOUNTER — Emergency Department
Admission: EM | Admit: 2021-11-06 | Discharge: 2021-11-06 | Disposition: A | Discharge status: Home / Self Care | Payer: Medicaid Other | Attending: Emergency Medicine | Admitting: Emergency Medicine

## 2021-11-06 ENCOUNTER — Other Ambulatory Visit: Payer: Self-pay

## 2021-11-06 ENCOUNTER — Emergency Department: Payer: Medicaid Other

## 2021-11-06 DIAGNOSIS — Y92410 Unspecified street and highway as the place of occurrence of the external cause: Secondary | ICD-10-CM | POA: Diagnosis not present

## 2021-11-06 DIAGNOSIS — S3992XA Unspecified injury of lower back, initial encounter: Secondary | ICD-10-CM | POA: Diagnosis not present

## 2021-11-06 DIAGNOSIS — S199XXA Unspecified injury of neck, initial encounter: Secondary | ICD-10-CM | POA: Insufficient documentation

## 2021-11-06 DIAGNOSIS — S39012A Strain of muscle, fascia and tendon of lower back, initial encounter: Secondary | ICD-10-CM

## 2021-11-06 DIAGNOSIS — S161XXA Strain of muscle, fascia and tendon at neck level, initial encounter: Secondary | ICD-10-CM

## 2021-11-06 MED ORDER — MELOXICAM 7.5 MG PO TABS
15.0000 mg | ORAL_TABLET | Freq: Once | ORAL | Status: AC
  Administered 2021-11-06: 15 mg via ORAL
  Filled 2021-11-06: qty 2

## 2021-11-06 MED ORDER — METHOCARBAMOL 500 MG PO TABS: 500.0000 mg | ORAL_TABLET | Freq: Four times a day (QID) | ORAL | 0 refills | Status: DC

## 2021-11-06 MED ORDER — METHOCARBAMOL 500 MG PO TABS
1000.0000 mg | ORAL_TABLET | Freq: Once | ORAL | Status: AC
  Administered 2021-11-06: 1000 mg via ORAL
  Filled 2021-11-06: qty 2

## 2021-11-06 MED ORDER — MELOXICAM 15 MG PO TABS: 15.0000 mg | ORAL_TABLET | Freq: Every day | ORAL | 0 refills | Status: DC
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
## 2021-12-25 ENCOUNTER — Emergency Department
Admission: EM | Admit: 2021-12-25 | Discharge: 2021-12-25 | Disposition: A | Discharge status: Home / Self Care | Payer: Medicaid Other | Attending: Emergency Medicine | Admitting: Emergency Medicine

## 2021-12-25 ENCOUNTER — Encounter: Payer: Self-pay | Admitting: Emergency Medicine

## 2021-12-25 DIAGNOSIS — R0981 Nasal congestion: Secondary | ICD-10-CM | POA: Diagnosis present

## 2021-12-25 DIAGNOSIS — J0101 Acute recurrent maxillary sinusitis: Secondary | ICD-10-CM | POA: Insufficient documentation

## 2021-12-25 MED ORDER — SULFAMETHOXAZOLE-TRIMETHOPRIM 800-160 MG PO TABS: 1.0000 | ORAL_TABLET | Freq: Two times a day (BID) | ORAL | 0 refills | Status: DC

## 2021-12-25 MED ORDER — SULFAMETHOXAZOLE-TRIMETHOPRIM 800-160 MG PO TABS
1.0000 | ORAL_TABLET | Freq: Once | ORAL | Status: AC
  Administered 2021-12-25: 1 via ORAL
  Filled 2021-12-25: qty 1

## 2021-12-25 MED ORDER — CETIRIZINE HCL 10 MG PO TABS: 10.0000 mg | ORAL_TABLET | Freq: Every day | ORAL | 0 refills | Status: DC

## 2021-12-25 MED ORDER — FLUTICASONE PROPIONATE 50 MCG/ACT NA SUSP: 1.0000 | Freq: Two times a day (BID) | NASAL | 0 refills | Status: DC

## 2021-12-25 NOTE — ED Triage Notes (Signed)
Pt presents via POV with complaints of sinus infection for the last month. She endorses head fullness, stuffy nose, and congestion and has tried OTC medication without improvement. Denies CP or SOB.  Declines respiratory swabs at this time.

## 2021-12-25 NOTE — ED Triage Notes (Deleted)
Pt presents with complaints of a sinus infection for the last month.  She endorses head fullness, stuffy nose, and congestion. Declines respiratory swabs at this time. Denies CP or SOB.
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
## 2022-01-22 ENCOUNTER — Emergency Department
Admission: EM | Admit: 2022-01-22 | Discharge: 2022-01-22 | Disposition: A | Discharge status: Home / Self Care | Payer: Medicaid Other | Attending: Emergency Medicine | Admitting: Emergency Medicine

## 2022-01-22 ENCOUNTER — Emergency Department: Payer: Medicaid Other

## 2022-01-22 ENCOUNTER — Other Ambulatory Visit: Payer: Self-pay

## 2022-01-22 DIAGNOSIS — Y990 Civilian activity done for income or pay: Secondary | ICD-10-CM | POA: Diagnosis not present

## 2022-01-22 DIAGNOSIS — S39012A Strain of muscle, fascia and tendon of lower back, initial encounter: Secondary | ICD-10-CM | POA: Insufficient documentation

## 2022-01-22 DIAGNOSIS — X500XXA Overexertion from strenuous movement or load, initial encounter: Secondary | ICD-10-CM | POA: Insufficient documentation

## 2022-01-22 DIAGNOSIS — M545 Low back pain, unspecified: Secondary | ICD-10-CM | POA: Diagnosis present

## 2022-01-22 LAB — URINALYSIS, ROUTINE W REFLEX MICROSCOPIC
Bilirubin Urine: NEGATIVE
Glucose, UA: NEGATIVE mg/dL
Hgb urine dipstick: NEGATIVE
Ketones, ur: NEGATIVE mg/dL
Leukocytes,Ua: NEGATIVE
Nitrite: NEGATIVE
Protein, ur: NEGATIVE mg/dL
Specific Gravity, Urine: 1.02 (ref 1.005–1.030)
pH: 6 (ref 5.0–8.0)

## 2022-01-22 MED ORDER — ONDANSETRON 4 MG PO TBDP
4.0000 mg | ORAL_TABLET | Freq: Once | ORAL | Status: AC
  Administered 2022-01-22: 4 mg via ORAL
  Filled 2022-01-22: qty 1

## 2022-01-22 MED ORDER — MELOXICAM 15 MG PO TABS: 15.0000 mg | ORAL_TABLET | Freq: Every day | ORAL | 2 refills | Status: DC

## 2022-01-22 MED ORDER — KETOROLAC TROMETHAMINE 30 MG/ML IJ SOLN
30.0000 mg | Freq: Once | INTRAMUSCULAR | Status: AC
Start: 2022-01-22 — End: 2022-01-22
  Administered 2022-01-22: 30 mg via INTRAMUSCULAR
  Filled 2022-01-22: qty 1

## 2022-01-22 MED ORDER — OXYCODONE-ACETAMINOPHEN 5-325 MG PO TABS
1.0000 | ORAL_TABLET | Freq: Once | ORAL | Status: AC
  Administered 2022-01-22: 1 via ORAL
  Filled 2022-01-22: qty 1

## 2022-01-22 MED ORDER — METHOCARBAMOL 750 MG PO TABS: 750.0000 mg | ORAL_TABLET | Freq: Three times a day (TID) | ORAL | 0 refills | Status: AC | PRN

## 2022-01-22 NOTE — ED Notes (Signed)
Pt refused discharge vital signs at this time  
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
## 2022-01-22 NOTE — Discharge Instructions (Addendum)
Take meloxicam and Robaxin as directed. 

## 2022-01-22 NOTE — ED Provider Notes (Signed)
[facility] Provider Note  Patient Contact: 9:47 PM (approximate)   History   Back Pain   HPI  Jonathan Deleon is a 51 y.o. adult presents to the emergency department with bilateral lower back pain.  Patient states that she has had pain off and on since April.  The pain is nonradiating and patient has had no bowel or bladder incontinence or saddle anesthesia.  She denies dysuria, hematuria or increased urinary frequency.      Physical Exam   Triage Vital Signs: ED Triage Vitals  Enc Vitals Group     BP 01/22/22 2015 (!) 151/92     Pulse Rate 01/22/22 2015 79     Resp 01/22/22 2015 18     Temp 01/22/22 2015 98.2 F (36.8 C)     Temp Source 01/22/22 2015 Oral     SpO2 01/22/22 2015 96 %     Weight 01/22/22 2015 160 lb (72.6 kg)     Height 01/22/22 2015 5\' 8"  (1.727 m)     Head Circumference --      Peak Flow --      Pain Score 01/22/22 2021 10     Pain Loc --      Pain Edu? --      Excl. in GC? --     Most recent vital signs: Vitals:   01/22/22 2015  BP: (!) 151/92  Pulse: 79  Resp: 18  Temp: 98.2 F (36.8 C)  SpO2: 96%     General: Alert and in no acute distress. Eyes:  PERRL. EOMI. Head: No acute traumatic findings ENT:      Ears:       Nose: No congestion/rhinnorhea.      Mouth/Throat: Mucous membranes are moist. Neck: No stridor. No cervical spine tenderness to palpation. Cardiovascular:  Good peripheral perfusion Respiratory: Normal respiratory effort without tachypnea or retractions. Lungs CTAB. Good air entry to the bases with no decreased or absent breath sounds. Gastrointestinal: Bowel sounds 4 quadrants. Soft and nontender to palpation. No guarding or rigidity. No palpable masses. No distention. No CVA tenderness. Musculoskeletal: Full range of motion to all extremities. Patient has paraspinal muscle tenderness along the lumbar spine.  Neurologic:  No gross focal neurologic deficits are appreciated.  Skin:   No rash  noted Other:   ED Results / Procedures / Treatments   Labs (all labs ordered are listed, but only abnormal results are displayed) Labs Reviewed  URINALYSIS, ROUTINE W REFLEX MICROSCOPIC - Abnormal; Notable for the following components:      Result Value   Color, Urine YELLOW (*)    APPearance HAZY (*)    All other components within normal limits        RADIOLOGY  I personally viewed and evaluated these images as part of my medical decision making, as well as reviewing the written report by the radiologist.  ED Provider Interpretation: I personally interpreted x-ray of the lumbar spine and there was no acute bony abnormality visualized.   PROCEDURES:  Critical Care performed: No  Procedures   MEDICATIONS ORDERED IN ED: Medications  ketorolac (TORADOL) 30 MG/ML injection 30 mg (has no administration in time range)  oxyCODONE-acetaminophen (PERCOCET/ROXICET) 5-325 MG per tablet 1 tablet (has no administration in time range)  ondansetron (ZOFRAN-ODT) disintegrating tablet 4 mg (has no administration in time range)     IMPRESSION / MDM / ASSESSMENT AND PLAN / ED COURSE  I reviewed the triage vital signs and the  nursing notes.                              Assessment and plan Lumbar strain 51 year old male presents to the emergency department with low back pain that is occurred off and on since April.  Patient was hypertensive at triage but vital signs otherwise reassuring.  Patient was alert, active and nontoxic-appearing with no apparent neurodeficits noted.  Urinalysis showed no signs of UTI and x-ray of the lumbar spine was unremarkable.  We will administer Toradol and a Percocet for acute pain with plans to send patient home with meloxicam and Robaxin.     FINAL CLINICAL IMPRESSION(S) / ED DIAGNOSES   Final diagnoses:  Strain of lumbar region, initial encounter     Rx / DC Orders   ED Discharge Orders     None        Note:  This document was  prepared using Dragon voice recognition software and may include unintentional dictation errors.   [name], [name] 01/22/22 2151    Minna Antis, MD 01/22/22 2255

## 2022-05-20 ENCOUNTER — Other Ambulatory Visit: Payer: Self-pay

## 2022-05-20 ENCOUNTER — Emergency Department
Admission: EM | Admit: 2022-05-20 | Discharge: 2022-05-20 | Disposition: A | Discharge status: Home / Self Care | Payer: Medicaid Other | Attending: Emergency Medicine | Admitting: Emergency Medicine

## 2022-05-20 DIAGNOSIS — J329 Chronic sinusitis, unspecified: Secondary | ICD-10-CM

## 2022-05-20 DIAGNOSIS — Z20822 Contact with and (suspected) exposure to covid-19: Secondary | ICD-10-CM | POA: Diagnosis not present

## 2022-05-20 DIAGNOSIS — R519 Headache, unspecified: Secondary | ICD-10-CM | POA: Insufficient documentation

## 2022-05-20 LAB — RESP PANEL BY RT-PCR (FLU A&B, COVID) ARPGX2
Influenza A by PCR: NEGATIVE
Influenza B by PCR: NEGATIVE
SARS Coronavirus 2 by RT PCR: NEGATIVE

## 2022-05-20 MED ORDER — OXYCODONE-ACETAMINOPHEN 5-325 MG PO TABS: 1.0000 | ORAL_TABLET | Freq: Three times a day (TID) | ORAL | 0 refills | Status: AC | PRN

## 2022-05-20 MED ORDER — PREDNISONE 20 MG PO TABS: 40.0000 mg | ORAL_TABLET | Freq: Every day | ORAL | 0 refills | Status: AC

## 2022-05-20 MED ORDER — AMOXICILLIN-POT CLAVULANATE 875-125 MG PO TABS: 1.0000 | ORAL_TABLET | Freq: Two times a day (BID) | ORAL | 0 refills | Status: AC

## 2022-05-20 MED ORDER — AMOXICILLIN-POT CLAVULANATE 875-125 MG PO TABS
1.0000 | ORAL_TABLET | Freq: Once | ORAL | Status: AC
  Administered 2022-05-20: 1 via ORAL
  Filled 2022-05-20: qty 1

## 2022-05-20 NOTE — ED Triage Notes (Signed)
Pt arrives with c/o headache that started 2 weeks ago. Pt denies n/v. Pt endorses dizziness.
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
## 2022-07-17 ENCOUNTER — Emergency Department
Admission: EM | Admit: 2022-07-17 | Discharge: 2022-07-18 | Disposition: A | Discharge status: Home / Self Care | Payer: Medicaid Other | Attending: Emergency Medicine | Admitting: Emergency Medicine

## 2022-07-17 ENCOUNTER — Emergency Department: Payer: Medicaid Other

## 2022-07-17 ENCOUNTER — Encounter: Payer: Self-pay | Admitting: Emergency Medicine

## 2022-07-17 DIAGNOSIS — I10 Essential (primary) hypertension: Secondary | ICD-10-CM | POA: Insufficient documentation

## 2022-07-17 DIAGNOSIS — M25511 Pain in right shoulder: Secondary | ICD-10-CM | POA: Insufficient documentation

## 2022-07-17 DIAGNOSIS — Z21 Asymptomatic human immunodeficiency virus [HIV] infection status: Secondary | ICD-10-CM | POA: Insufficient documentation

## 2022-07-17 DIAGNOSIS — G8929 Other chronic pain: Secondary | ICD-10-CM | POA: Insufficient documentation

## 2022-07-17 MED ORDER — KETOROLAC TROMETHAMINE 30 MG/ML IJ SOLN
30.0000 mg | Freq: Once | INTRAMUSCULAR | Status: AC
  Administered 2022-07-18: 30 mg via INTRAMUSCULAR
  Filled 2022-07-17: qty 1

## 2022-07-17 NOTE — ED Triage Notes (Signed)
Pt presents via POV with complaints of R shoulder pain since yesterday. No deformities noted. No injury - rates the pain 8/10.
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
## 2022-07-17 NOTE — Discharge Instructions (Addendum)
Take acetaminophen 650 mg and ibuprofen 400 mg every 6 hours for pain.  Take with food.  Dr. Audelia Acton for an appointment next steps regarding your shoulder pain.  Thank you for choosing [place] for your health care today!  Please see your primary doctor this week for a follow up appointment.   Sometimes, in the early stages of certain disease courses it is difficult to detect in the emergency department evaluation -- so, it is important that you continue to monitor your symptoms and call your doctor right away or return to the emergency department if you develop any new or worsening symptoms.  Please go to the following website to schedule new (and existing) patient appointments:   [email or web address]  If you do not have a primary doctor try calling the following clinics to establish care:  If you have insurance:  [facility] [phone] [address] [place] [place] [address]   [facility]  [phone] [address] [place]., [place] [address]   If you do not have insurance:  [facility]  [phone] [address] [place] [address]   The following is another list of primary care offices in the area who are accepting new patients at this time.  Please reach out to one of them directly and let them know you would like to schedule an appointment to follow up on an Emergency Department visit, and/or to establish a new primary care provider (PCP).  There are likely other primary care clinics in the are who are accepting new patients, but this is an excellent place to start:  [facility] Lead physician: Dr Shirlee Latch [address] [place], [place] [address] [phone]  [facility] Lead Physician: Dr Alba Cory [address], [place], [place] [address] [phone]  [facility]  Lead Physician: Dr Olevia Perches [address] [place], [place]  [address] [phone]  [facility] Lead Physician: Dr Sofie Hartigan [address] [place], [place], [place] [address] [phone]  [facility] Lead Physician: Dr Bari Edward [address] [place], [place] [address] [phone]   It was my pleasure to care for you today.   Daneil Dan Modesto Charon, MD

## 2022-09-13 ENCOUNTER — Other Ambulatory Visit: Payer: Self-pay

## 2022-09-13 ENCOUNTER — Emergency Department
Admission: EM | Admit: 2022-09-13 | Discharge: 2022-09-13 | Disposition: A | Discharge status: Home / Self Care | Payer: Medicaid Other | Attending: Emergency Medicine | Admitting: Emergency Medicine

## 2022-09-13 ENCOUNTER — Emergency Department: Payer: Medicaid Other

## 2022-09-13 DIAGNOSIS — R1032 Left lower quadrant pain: Secondary | ICD-10-CM | POA: Diagnosis not present

## 2022-09-13 DIAGNOSIS — E876 Hypokalemia: Secondary | ICD-10-CM

## 2022-09-13 DIAGNOSIS — Z21 Asymptomatic human immunodeficiency virus [HIV] infection status: Secondary | ICD-10-CM | POA: Diagnosis not present

## 2022-09-13 DIAGNOSIS — R197 Diarrhea, unspecified: Secondary | ICD-10-CM | POA: Diagnosis not present

## 2022-09-13 LAB — MAGNESIUM: Magnesium: 1.1 mg/dL — ABNORMAL LOW (ref 1.7–2.4)

## 2022-09-13 LAB — CBC WITH DIFFERENTIAL/PLATELET
Abs Immature Granulocytes: 0.01 10*3/uL (ref 0.00–0.07)
Basophils Absolute: 0 10*3/uL (ref 0.0–0.1)
Basophils Relative: 1 %
Eosinophils Absolute: 0.1 10*3/uL (ref 0.0–0.5)
Eosinophils Relative: 2 %
HCT: 42 % (ref 39.0–52.0)
Hemoglobin: 13.7 g/dL (ref 13.0–17.0)
Immature Granulocytes: 0 %
Lymphocytes Relative: 47 %
Lymphs Abs: 2.9 10*3/uL (ref 0.7–4.0)
MCH: 27.8 pg (ref 26.0–34.0)
MCHC: 32.6 g/dL (ref 30.0–36.0)
MCV: 85.2 fL (ref 80.0–100.0)
Monocytes Absolute: 0.6 10*3/uL (ref 0.1–1.0)
Monocytes Relative: 9 %
Neutro Abs: 2.5 10*3/uL (ref 1.7–7.7)
Neutrophils Relative %: 41 %
Platelets: 252 10*3/uL (ref 150–400)
RBC: 4.93 MIL/uL (ref 4.22–5.81)
RDW: 12.9 % (ref 11.5–15.5)
WBC: 6.1 10*3/uL (ref 4.0–10.5)
nRBC: 0 % (ref 0.0–0.2)

## 2022-09-13 LAB — COMPREHENSIVE METABOLIC PANEL
ALT: 8 U/L (ref 0–44)
AST: 9 U/L — ABNORMAL LOW (ref 15–41)
Albumin: 2.1 g/dL — ABNORMAL LOW (ref 3.5–5.0)
Alkaline Phosphatase: 44 U/L (ref 38–126)
Anion gap: 2 — ABNORMAL LOW (ref 5–15)
BUN: 13 mg/dL (ref 6–20)
CO2: 14 mmol/L — ABNORMAL LOW (ref 22–32)
Calcium: 4.8 mg/dL — CL (ref 8.9–10.3)
Chloride: 124 mmol/L — ABNORMAL HIGH (ref 98–111)
Creatinine, Ser: 0.5 mg/dL — ABNORMAL LOW (ref 0.61–1.24)
GFR, Estimated: >60 mL/min (ref >60)
Glucose, Bld: 62 mg/dL — ABNORMAL LOW (ref 70–99)
Potassium: <2 mmol/L — CL (ref 3.5–5.1)
Sodium: 140 mmol/L (ref 135–145)
Total Bilirubin: 0.4 mg/dL (ref 0.3–1.2)
Total Protein: 4.1 g/dL — ABNORMAL LOW (ref 6.5–8.1)

## 2022-09-13 LAB — URINALYSIS, ROUTINE W REFLEX MICROSCOPIC
Bacteria, UA: NONE SEEN
Bilirubin Urine: NEGATIVE
Glucose, UA: NEGATIVE mg/dL
Hgb urine dipstick: NEGATIVE
Ketones, ur: NEGATIVE mg/dL
Leukocytes,Ua: NEGATIVE
Nitrite: NEGATIVE
Protein, ur: NEGATIVE mg/dL
Specific Gravity, Urine: >1.046 — ABNORMAL HIGH (ref 1.005–1.030)
pH: 6 (ref 5.0–8.0)

## 2022-09-13 LAB — BASIC METABOLIC PANEL
Anion gap: 6 (ref 5–15)
BUN: 18 mg/dL (ref 6–20)
CO2: 24 mmol/L (ref 22–32)
Calcium: 8.2 mg/dL — ABNORMAL LOW (ref 8.9–10.3)
Chloride: 103 mmol/L (ref 98–111)
Creatinine, Ser: 1.05 mg/dL (ref 0.61–1.24)
GFR, Estimated: >60 mL/min (ref >60)
Glucose, Bld: 126 mg/dL — ABNORMAL HIGH (ref 70–99)
Potassium: 3.2 mmol/L — ABNORMAL LOW (ref 3.5–5.1)
Sodium: 133 mmol/L — ABNORMAL LOW (ref 135–145)

## 2022-09-13 MED ORDER — POTASSIUM CHLORIDE 10 MEQ/100ML IV SOLN
10.0000 meq | INTRAVENOUS | Status: DC
  Administered 2022-09-13: 10 meq via INTRAVENOUS
  Filled 2022-09-13: qty 100

## 2022-09-13 MED ORDER — MAGNESIUM SULFATE 2 GM/50ML IV SOLN
2.0000 g | Freq: Once | INTRAVENOUS | Status: AC
  Administered 2022-09-13: 2 g via INTRAVENOUS
  Filled 2022-09-13: qty 50

## 2022-09-13 MED ORDER — ONDANSETRON 4 MG PO TBDP: 4.0000 mg | ORAL_TABLET | Freq: Three times a day (TID) | ORAL | 0 refills | Status: DC | PRN

## 2022-09-13 MED ORDER — LACTATED RINGERS IV BOLUS
1000.0000 mL | Freq: Once | INTRAVENOUS | Status: AC
  Administered 2022-09-13: 1000 mL via INTRAVENOUS

## 2022-09-13 MED ORDER — IOHEXOL 300 MG/ML  SOLN
100.0000 mL | Freq: Once | INTRAMUSCULAR | Status: AC | PRN
  Administered 2022-09-13: 100 mL via INTRAVENOUS

## 2022-09-13 MED ORDER — ONDANSETRON HCL 4 MG/2ML IJ SOLN
4.0000 mg | Freq: Once | INTRAMUSCULAR | Status: AC
  Administered 2022-09-13: 4 mg via INTRAVENOUS
  Filled 2022-09-13: qty 2

## 2022-09-13 MED ORDER — POTASSIUM CHLORIDE CRYS ER 20 MEQ PO TBCR
40.0000 meq | EXTENDED_RELEASE_TABLET | Freq: Once | ORAL | Status: AC
  Administered 2022-09-13: 40 meq via ORAL
  Filled 2022-09-13: qty 2

## 2022-09-13 NOTE — ED Triage Notes (Signed)
Ate a bad hamburger 3 days ago and has been having diarrhea since.
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
## 2022-09-21 ENCOUNTER — Other Ambulatory Visit: Payer: Self-pay

## 2022-09-21 ENCOUNTER — Emergency Department
Admission: EM | Admit: 2022-09-21 | Discharge: 2022-09-21 | Disposition: A | Discharge status: Home / Self Care | Payer: Medicaid Other | Attending: Emergency Medicine | Admitting: Emergency Medicine

## 2022-09-21 DIAGNOSIS — J0101 Acute recurrent maxillary sinusitis: Secondary | ICD-10-CM | POA: Insufficient documentation

## 2022-09-21 DIAGNOSIS — R519 Headache, unspecified: Secondary | ICD-10-CM | POA: Diagnosis present

## 2022-09-21 MED ORDER — SULFAMETHOXAZOLE-TRIMETHOPRIM 800-160 MG PO TABS
1.0000 | ORAL_TABLET | Freq: Once | ORAL | Status: AC
  Administered 2022-09-21: 1 via ORAL
  Filled 2022-09-21: qty 1

## 2022-09-21 MED ORDER — SULFAMETHOXAZOLE-TRIMETHOPRIM 800-160 MG PO TABS: 1.0000 | ORAL_TABLET | Freq: Two times a day (BID) | ORAL | 0 refills | Status: DC

## 2022-09-21 MED ORDER — HYDROCODONE-ACETAMINOPHEN 5-325 MG PO TABS
1.0000 | ORAL_TABLET | Freq: Once | ORAL | Status: AC
  Administered 2022-09-21: 1 via ORAL
  Filled 2022-09-21: qty 1

## 2022-09-21 MED ORDER — MAGIC MOUTHWASH W/LIDOCAINE: 5.0000 mL | Freq: Four times a day (QID) | ORAL | 0 refills | Status: DC
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
## 2022-09-21 NOTE — ED Triage Notes (Addendum)
Pt to ED via POV c/o possible sinus infection. Pt with nasal drainage and congestion. Pt states this started about 2 weeks ago. Denies fevers. Pt in NAD at this time.

## 2022-11-20 ENCOUNTER — Emergency Department
Admission: EM | Admit: 2022-11-20 | Discharge: 2022-11-20 | Disposition: A | Discharge status: Home / Self Care | Payer: Medicaid Other | Attending: Emergency Medicine | Admitting: Emergency Medicine

## 2022-11-20 ENCOUNTER — Other Ambulatory Visit: Payer: Self-pay

## 2022-11-20 DIAGNOSIS — R0981 Nasal congestion: Secondary | ICD-10-CM | POA: Diagnosis present

## 2022-11-20 DIAGNOSIS — J329 Chronic sinusitis, unspecified: Secondary | ICD-10-CM | POA: Diagnosis not present

## 2022-11-20 DIAGNOSIS — J300 Vasomotor rhinitis: Secondary | ICD-10-CM | POA: Diagnosis not present

## 2022-11-20 MED ORDER — LORATADINE 10 MG PO TABS
10.0000 mg | ORAL_TABLET | Freq: Every day | ORAL | Status: DC
  Administered 2022-11-20: 10 mg via ORAL
  Filled 2022-11-20: qty 1

## 2022-11-20 MED ORDER — FEXOFENADINE-PSEUDOEPHED ER 180-240 MG PO TB24: 1.0000 | ORAL_TABLET | Freq: Every day | ORAL | 3 refills | Status: AC

## 2022-11-20 MED ORDER — PSEUDOEPHEDRINE HCL ER 120 MG PO TB12: 120.0000 mg | ORAL_TABLET | Freq: Two times a day (BID) | ORAL | Status: DC

## 2022-11-20 MED ORDER — HYDROCODONE-ACETAMINOPHEN 5-325 MG PO TABS: 1.0000 | ORAL_TABLET | Freq: Three times a day (TID) | ORAL | 0 refills | Status: DC | PRN

## 2022-11-20 MED ORDER — FLUCONAZOLE 150 MG PO TABS: 150.0000 mg | ORAL_TABLET | Freq: Every day | ORAL | 0 refills | Status: AC

## 2022-11-20 MED ORDER — CLINDAMYCIN HCL 150 MG PO CAPS
150.0000 mg | ORAL_CAPSULE | Freq: Once | ORAL | Status: AC
  Administered 2022-11-20: 150 mg via ORAL
  Filled 2022-11-20: qty 1

## 2022-11-20 MED ORDER — CLINDAMYCIN HCL 150 MG PO CAPS: 150.0000 mg | ORAL_CAPSULE | Freq: Three times a day (TID) | ORAL | 0 refills | Status: AC

## 2022-11-20 NOTE — ED Triage Notes (Signed)
C/O sinus congestion x 2 weeks.  Denies fever.  AAOx3.  Skin warm and dry. NAD

## 2022-11-20 NOTE — Discharge Instructions (Signed)
Take the prescription meds as directed.  Follow-up with primary provider for ongoing symptoms.  Return to the ED if needed. 

## 2022-11-20 NOTE — ED Provider Notes (Signed)
[facility] Emergency Department Provider Note     Event Date/Time   First MD Initiated Contact with Patient 11/20/22 1623     (approximate)   History   sinus congestion   HPI  Jonathan Deleon is a 52 y.o. adult who presents to the ED for evaluation of sinus pressure and nasal drainage.  Patient with history of chronic sinusitis and nonseasonal rhinitis, presents with symptoms concerning for acute on chronic infection.  Patient denies any fevers, chills, or sweats.  They do endorse some facial pressure and intermittent headache pain.  Physical Exam   Triage Vital Signs: ED Triage Vitals  Enc Vitals Group     BP 11/20/22 1543 (!) 122/99     Pulse Rate 11/20/22 1543 83     Resp 11/20/22 1543 16     Temp 11/20/22 1543 98.4 F (36.9 C)     Temp Source 11/20/22 1543 Oral     SpO2 11/20/22 1543 97 %     Weight 11/20/22 1541 160 lb 0.9 oz (72.6 kg)     Height 11/20/22 1541  (1.753 m)     Head Circumference --      Peak Flow --      Pain Score 11/20/22 1731 0     Pain Loc --      Pain Edu? --      Excl. in GC? --     Most recent vital signs: Vitals:   11/20/22 1543  BP: (!) 122/99  Pulse: 83  Resp: 16  Temp: 98.4 F (36.9 C)  SpO2: 97%    General Awake, no distress.  NAD HEENT NCAT. PERRL. EOMI. No rhinorrhea. Mucous membranes are moist.  Uvula is midline and tonsils are flat.  No oropharyngeal lesions appreciated.  Facial pressure on manipulation of the maxillary sinuses. CV:  Good peripheral perfusion.  RESP:  Normal effort.  ABD:  No distention.   ED Results / Procedures / Treatments   Labs (all labs ordered are listed, but only abnormal results are displayed) Labs Reviewed - No data to display   EKG   RADIOLOGY   No results found.   PROCEDURES:  Critical Care performed: No  Procedures   MEDICATIONS ORDERED IN ED: Medications  loratadine (CLARITIN) tablet 10 mg (10 mg Oral Given 11/20/22 1731)    And   pseudoephedrine (SUDAFED) 12 hr tablet 120 mg (has no administration in time range)  clindamycin (CLEOCIN) capsule 150 mg (150 mg Oral Given 11/20/22 1731)     IMPRESSION / MDM / ASSESSMENT AND PLAN / ED COURSE  I reviewed the triage vital signs and the nursing notes.                              Differential diagnosis includes, but is not limited to, sinusitis, rhinitis, migraine headache, seasonal allergies  Patient's presentation is most consistent with acute, uncomplicated illness.  Patient's diagnosis is consistent with allergic rhinitis and acute on chronic sinusitis. Patient will be discharged home with prescriptions for fexofenadine pseudoephedrine, clindamycin, fluconazole, and hydrocodone (#9). Patient is to follow up with primary provider as needed or otherwise directed. Patient is given ED precautions to return to the ED for any worsening or new symptoms.  FINAL CLINICAL IMPRESSION(S) / ED DIAGNOSES   Final diagnoses:  Chronic sinusitis, unspecified location  Vasomotor rhinitis     Rx / DC Orders   ED Discharge Orders  Ordered    fexofenadine-pseudoephedrine (ALLEGRA-D 24) 180-240 MG 24 hr tablet  Daily        11/20/22 1713    clindamycin (CLEOCIN) 150 MG capsule  3 times daily        11/20/22 1715    fluconazole (DIFLUCAN) 150 MG tablet  Daily        11/20/22 1715    HYDROcodone-acetaminophen (NORCO) 5-325 MG tablet  3 times daily PRN        11/20/22 1717             Note:  This document was prepared using Dragon voice recognition software and may include unintentional dictation errors.    Lissa Hoard, PA-C 11/20/22 1813    Phineas Semen, MD 11/20/22 [phone]

## 2022-11-20 NOTE — ED Provider Triage Note (Signed)
Emergency Medicine Provider Triage Evaluation Note  El Diego A Fulwider , a 52 y.o. adult  was evaluated in triage.  Pt complains of sinus infection and postnasal drip x 2 weeks. No improvement with OTC allergy medication.  Physical Exam  Ht  (1.753 m)   Wt 72.6 kg   BMI 23.64 kg/m  Gen:   Awake, no distress   Resp:  Normal effort  MSK:   Moves extremities without difficulty  Other:    Medical Decision Making  Medically screening exam initiated at 3:44 PM.  Appropriate orders placed.  El [place] A Ureta was informed that the remainder of the evaluation will be completed by another provider, this initial triage assessment does not replace that evaluation, and the importance of remaining in the ED until their evaluation is complete.    Chinita Pester, FNP 11/20/22 1544

## 2022-11-22 ENCOUNTER — Other Ambulatory Visit: Payer: Self-pay

## 2022-11-22 ENCOUNTER — Encounter: Payer: Self-pay | Admitting: Intensive Care

## 2022-11-22 ENCOUNTER — Emergency Department
Admission: EM | Admit: 2022-11-22 | Discharge: 2022-11-22 | Disposition: A | Discharge status: Home / Self Care | Payer: Medicaid Other | Attending: Emergency Medicine | Admitting: Emergency Medicine

## 2022-11-22 DIAGNOSIS — J019 Acute sinusitis, unspecified: Secondary | ICD-10-CM | POA: Insufficient documentation

## 2022-11-22 DIAGNOSIS — R0981 Nasal congestion: Secondary | ICD-10-CM | POA: Diagnosis present

## 2022-11-22 DIAGNOSIS — B9689 Other specified bacterial agents as the cause of diseases classified elsewhere: Secondary | ICD-10-CM

## 2022-11-22 MED ORDER — SULFAMETHOXAZOLE-TRIMETHOPRIM 800-160 MG PO TABS: 1.0000 | ORAL_TABLET | Freq: Two times a day (BID) | ORAL | 0 refills | Status: AC

## 2022-11-22 MED ORDER — HYDROCODONE-ACETAMINOPHEN 5-325 MG PO TABS
1.0000 | ORAL_TABLET | Freq: Once | ORAL | Status: AC
  Administered 2022-11-22: 1 via ORAL
  Filled 2022-11-22: qty 1

## 2022-11-22 MED ORDER — SULFAMETHOXAZOLE-TRIMETHOPRIM 800-160 MG PO TABS
1.0000 | ORAL_TABLET | Freq: Once | ORAL | Status: AC
  Administered 2022-11-22: 1 via ORAL
  Filled 2022-11-22: qty 1

## 2022-11-22 MED ORDER — PREDNISONE 20 MG PO TABS
60.0000 mg | ORAL_TABLET | Freq: Once | ORAL | Status: AC
  Administered 2022-11-22: 60 mg via ORAL
  Filled 2022-11-22: qty 3

## 2022-11-22 MED ORDER — PREDNISONE 50 MG PO TABS: 50.0000 mg | ORAL_TABLET | Freq: Every day | ORAL | 0 refills | Status: DC

## 2022-11-22 NOTE — ED Notes (Signed)
Patient states a ride is waiting outside.

## 2022-11-22 NOTE — ED Triage Notes (Signed)
Patient reports she has sinus infection and here for antibiotic.
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
## 2023-06-02 ENCOUNTER — Other Ambulatory Visit: Payer: Self-pay

## 2023-06-02 ENCOUNTER — Emergency Department: Payer: Medicaid Other

## 2023-06-02 DIAGNOSIS — J0100 Acute maxillary sinusitis, unspecified: Secondary | ICD-10-CM | POA: Diagnosis not present

## 2023-06-02 DIAGNOSIS — R519 Headache, unspecified: Secondary | ICD-10-CM | POA: Diagnosis present

## 2023-06-02 NOTE — ED Triage Notes (Signed)
Pt to ED via POV c/o left sided chest pain, left ear pain, and sinus pain. Pt has been having sinus issues for 3months now. Ear pain and chest pain has been going on for 2 weeks. Denies SOB, dizziness, fevers

## 2023-06-02 NOTE — ED Notes (Signed)
Pt stated "I'm not getting no blood work, the doctor can get it in the room" this tech informed pt that blood work is needed due to cc.

## 2023-06-03 ENCOUNTER — Emergency Department
Admission: EM | Admit: 2023-06-03 | Discharge: 2023-06-03 | Disposition: A | Discharge status: Home / Self Care | Payer: Medicaid Other | Attending: Emergency Medicine | Admitting: Emergency Medicine

## 2023-06-03 DIAGNOSIS — J0100 Acute maxillary sinusitis, unspecified: Secondary | ICD-10-CM

## 2023-06-03 MED ORDER — PREDNISONE 20 MG PO TABS
60.0000 mg | ORAL_TABLET | Freq: Once | ORAL | Status: AC
  Administered 2023-06-03: 60 mg via ORAL
  Filled 2023-06-03: qty 3

## 2023-06-03 MED ORDER — SULFAMETHOXAZOLE-TRIMETHOPRIM 800-160 MG PO TABS
1.0000 | ORAL_TABLET | Freq: Once | ORAL | Status: AC
  Administered 2023-06-03: 1 via ORAL
  Filled 2023-06-03: qty 1

## 2023-06-03 MED ORDER — SULFAMETHOXAZOLE-TRIMETHOPRIM 800-160 MG PO TABS: 1.0000 | ORAL_TABLET | Freq: Two times a day (BID) | ORAL | 0 refills | Status: AC

## 2023-06-03 NOTE — ED Provider Notes (Signed)
[facility] Provider Note    Event Date/Time   First MD Initiated Contact with Patient 06/03/23 0157     (approximate)   History   Chest Pain, Facial Pain, and Otalgia   HPI  Jonathan Deleon is a 52 y.o. adult who presents to the ED for evaluation of Chest Pain, Facial Pain, and Otalgia   Patient to the ED requesting Bactrim and prednisone for a sinus infection.  Reports is of the same symptoms that she felt 6 months ago when she was seen here for the same.  She does report some chest "drainage" and refuses blood work because she "knows what it is.  "Acknowledges the possibility of undiagnosed cardiac pathology.   Physical Exam   Triage Vital Signs: ED Triage Vitals  Encounter Vitals Group     BP 06/02/23 2257 (!) 155/85     Systolic BP Percentile --      Diastolic BP Percentile --      Pulse Rate 06/02/23 2257 98     Resp 06/02/23 2257 20     Temp 06/02/23 2257 98.6 F (37 C)     Temp Source 06/02/23 2257 Oral     SpO2 06/02/23 2257 100 %     Weight --      Height 06/02/23 2252 5\' 9"  (1.753 m)     Head Circumference --      Peak Flow --      Pain Score 06/02/23 2252 10     Pain Loc --      Pain Education --      Exclude from Growth Chart --     Most recent vital signs: Vitals:   06/02/23 2257  BP: (!) 155/85  Pulse: 98  Resp: 20  Temp: 98.6 F (37 C)  SpO2: 100%    General: Awake, no distress.  Well-appearing.  No signs of AOM.  Frequently sniffling CV:  Good peripheral perfusion.  Resp:  Normal effort.  Abd:  No distention.  MSK:  No deformity noted.  Neuro:  No focal deficits appreciated. Other:     ED Results / Procedures / Treatments   Labs (all labs ordered are listed, but only abnormal results are displayed) Labs Reviewed - No data to display  EKG Sinus rhythm with a rate of 90 bpm.  Normal axis and intervals.  No clear signs of acute ischemia.  RADIOLOGY CXR interpreted by me without evidence of acute  cardiopulmonary pathology.  Official radiology report(s): DG Chest 2 View  Result Date: 06/03/2023 CLINICAL DATA:  Worsening left-sided chest pain EXAM: CHEST - 2 VIEW COMPARISON:  10/02/2020 FINDINGS: The heart size and mediastinal contours are within normal limits. Both lungs are clear. The visualized skeletal structures are unremarkable. IMPRESSION: No active cardiopulmonary disease. Electronically Signed   By: Minerva Fester M.D.   On: 06/03/2023 02:20    PROCEDURES and INTERVENTIONS:  .1-3 Lead EKG Interpretation  Performed by: Delton Prairie, MD Authorized by: Delton Prairie, MD     Interpretation: normal     ECG rate:  90   ECG rate assessment: normal     Rhythm: sinus rhythm     Ectopy: none     Conduction: normal     Medications  predniSONE (DELTASONE) tablet 60 mg (has no administration in time range)  sulfamethoxazole-trimethoprim (BACTRIM DS) 800-160 MG per tablet 1 tablet (has no administration in time range)     IMPRESSION / MDM / ASSESSMENT AND PLAN / ED  COURSE  I reviewed the triage vital signs and the nursing notes.  Differential diagnosis includes, but is not limited to, ACS, PTX, PNA, muscle strain/spasm, PE, dissection, anxiety, pleural effusion  {Patient presents with symptoms of an acute illness or injury that is potentially life-threatening.  Patient presents to the ED with evidence of a sinus infection.  Systemically well.  Clear CXR.  Refusing chest pain labs and screening blood work, and has capacity to make this decision.  Will discharge with 3 days of Bactrim      FINAL CLINICAL IMPRESSION(S) / ED DIAGNOSES   Final diagnoses:  Acute non-recurrent maxillary sinusitis     Rx / DC Orders   ED Discharge Orders          Ordered    sulfamethoxazole-trimethoprim (BACTRIM DS) 800-160 MG tablet  2 times daily        06/03/23 0327             Note:  This document was prepared using Dragon voice recognition software and may include  unintentional dictation errors.   Delton Prairie, MD 06/03/23 [phone]

## 2023-07-09 ENCOUNTER — Emergency Department
Admission: EM | Admit: 2023-07-09 | Discharge: 2023-07-09 | Disposition: A | Discharge status: Home / Self Care | Payer: Medicaid Other | Attending: Emergency Medicine | Admitting: Emergency Medicine

## 2023-07-09 ENCOUNTER — Other Ambulatory Visit: Payer: Self-pay

## 2023-07-09 ENCOUNTER — Encounter: Payer: Self-pay | Admitting: Intensive Care

## 2023-07-09 DIAGNOSIS — J0191 Acute recurrent sinusitis, unspecified: Secondary | ICD-10-CM | POA: Insufficient documentation

## 2023-07-09 DIAGNOSIS — Z21 Asymptomatic human immunodeficiency virus [HIV] infection status: Secondary | ICD-10-CM | POA: Diagnosis not present

## 2023-07-09 DIAGNOSIS — I10 Essential (primary) hypertension: Secondary | ICD-10-CM | POA: Diagnosis not present

## 2023-07-09 DIAGNOSIS — Z8673 Personal history of transient ischemic attack (TIA), and cerebral infarction without residual deficits: Secondary | ICD-10-CM | POA: Diagnosis not present

## 2023-07-09 DIAGNOSIS — R0981 Nasal congestion: Secondary | ICD-10-CM | POA: Diagnosis present

## 2023-07-09 MED ORDER — KETOROLAC TROMETHAMINE 10 MG PO TABS: 10.0000 mg | ORAL_TABLET | Freq: Four times a day (QID) | ORAL | 0 refills | Status: AC | PRN

## 2023-07-09 MED ORDER — CEFDINIR 300 MG PO CAPS: 300.0000 mg | ORAL_CAPSULE | Freq: Two times a day (BID) | ORAL | 0 refills | Status: AC

## 2023-07-09 MED ORDER — KETOROLAC TROMETHAMINE 10 MG PO TABS
10.0000 mg | ORAL_TABLET | Freq: Once | ORAL | Status: AC
  Administered 2023-07-09: 10 mg via ORAL
  Filled 2023-07-09: qty 1

## 2023-07-09 MED ORDER — CEFDINIR 300 MG PO CAPS
300.0000 mg | ORAL_CAPSULE | Freq: Once | ORAL | Status: AC
  Administered 2023-07-09: 300 mg via ORAL
  Filled 2023-07-09: qty 1

## 2023-07-09 NOTE — ED Triage Notes (Signed)
Patient c/o congestion X2-3 weeks. Coughing up yellow and green phlegm

## 2023-07-09 NOTE — ED Provider Notes (Signed)
[facility] Provider Note    Event Date/Time   First MD Initiated Contact with Patient 07/09/23 2055     (approximate)   History   Chief Complaint Nasal Congestion   HPI  Jonathan Deleon is a 52 y.o. adult who presents to the ED complaining of nasal congestion.  Patient reports that she has been dealing with about 2 weeks of nasal congestion and pain across her sinuses.  She describes burning discomfort with yellow and green drainage from her nose along with postnasal drip.  She reports feeling feverish with chills and headache as well as sore throat.  She denies any cough, difficulty breathing, nausea, or vomiting.  She describes symptoms as similar to prior sinus infections, which she states she has dealt with frequently in the past.     Physical Exam   Triage Vital Signs: ED Triage Vitals  Encounter Vitals Group     BP 07/09/23 1836 (!) 155/91     Systolic BP Percentile --      Diastolic BP Percentile --      Pulse Rate 07/09/23 1836 65     Resp 07/09/23 1836 18     Temp 07/09/23 1836 98.5 F (36.9 C)     Temp Source 07/09/23 1836 Oral     SpO2 07/09/23 1836 97 %     Weight 07/09/23 1834 160 lb 0.9 oz (72.6 kg)     Height 07/09/23 1834 5\' 9"  (1.753 m)     Head Circumference --      Peak Flow --      Pain Score 07/09/23 1834 10     Pain Loc --      Pain Education --      Exclude from Growth Chart --     Most recent vital signs: Vitals:   07/09/23 1836  BP: (!) 155/91  Pulse: 65  Resp: 18  Temp: 98.5 F (36.9 C)  SpO2: 97%    Constitutional: Alert and oriented. Eyes: Conjunctivae are normal. Head: Atraumatic. Nose: No congestion/rhinnorhea. Mouth/Throat: Mucous membranes are moist.  Posterior oropharynx with erythema, no edema or exudates noted. Cardiovascular: Normal rate, regular rhythm. Grossly normal heart sounds.  2+ radial pulses bilaterally. Respiratory: Normal respiratory effort.  No retractions. Lungs  CTAB. Gastrointestinal: Soft and nontender. No distention. Musculoskeletal: No lower extremity tenderness nor edema.  Neurologic:  Normal speech and language. No gross focal neurologic deficits are appreciated.    ED Results / Procedures / Treatments   Labs (all labs ordered are listed, but only abnormal results are displayed) Labs Reviewed - No data to display   PROCEDURES:  Critical Care performed: No  Procedures   MEDICATIONS ORDERED IN ED: Medications  cefdinir (OMNICEF) capsule 300 mg (has no administration in time range)     IMPRESSION / MDM / ASSESSMENT AND PLAN / ED COURSE  I reviewed the triage vital signs and the nursing notes.                              52 y.o. adult with past medical history of hypertension, seizures, stroke, and HIV who presents to the ED complaining of nasal congestion, drainage, sore throat, and subjective fevers for the past 2 weeks.  Patient's presentation is most consistent with acute, uncomplicated illness.  Differential diagnosis includes, but is not limited to, bacterial sinusitis, viral sinusitis, pharyngitis, pneumonia.  Patient well-appearing and in no acute distress, vital signs  are unremarkable.  Symptoms seem consistent with sinusitis, given 2 weeks of symptoms with purulent drainage, we will treat with antibiotics.  She was treated with Bactrim about a month and a half ago, we will treat with cefdinir given penicillin allergy is mild.  Patient counseled to follow-up with PCP and to return to the ED for new or worsening symptoms.  Patient agrees with plan.      FINAL CLINICAL IMPRESSION(S) / ED DIAGNOSES   Final diagnoses:  Acute recurrent sinusitis, unspecified location     Rx / DC Orders   ED Discharge Orders          Ordered    cefdinir (OMNICEF) 300 MG capsule  2 times daily        07/09/23 2109    ketorolac (TORADOL) 10 MG tablet  Every 6 hours PRN        07/09/23 2109             Note:  This  document was prepared using Dragon voice recognition software and may include unintentional dictation errors.   Chesley Noon, MD 07/09/23 2121

## 2023-09-07 ENCOUNTER — Other Ambulatory Visit: Payer: Self-pay

## 2023-09-07 DIAGNOSIS — I10 Essential (primary) hypertension: Secondary | ICD-10-CM | POA: Diagnosis not present

## 2023-09-07 DIAGNOSIS — R059 Cough, unspecified: Secondary | ICD-10-CM | POA: Diagnosis present

## 2023-09-07 DIAGNOSIS — J0101 Acute recurrent maxillary sinusitis: Secondary | ICD-10-CM | POA: Insufficient documentation

## 2023-09-07 DIAGNOSIS — Z21 Asymptomatic human immunodeficiency virus [HIV] infection status: Secondary | ICD-10-CM | POA: Diagnosis not present

## 2023-09-07 DIAGNOSIS — Z7982 Long term (current) use of aspirin: Secondary | ICD-10-CM | POA: Insufficient documentation

## 2023-09-07 DIAGNOSIS — Z20822 Contact with and (suspected) exposure to covid-19: Secondary | ICD-10-CM | POA: Diagnosis not present

## 2023-09-07 LAB — RESP PANEL BY RT-PCR (RSV, FLU A&B, COVID)  RVPGX2
Influenza A by PCR: NEGATIVE
Influenza B by PCR: NEGATIVE
Resp Syncytial Virus by PCR: NEGATIVE
SARS Coronavirus 2 by RT PCR: NEGATIVE

## 2023-09-07 NOTE — ED Triage Notes (Signed)
Pt sts that she has been having sinus problems.

## 2023-09-08 ENCOUNTER — Emergency Department
Admission: EM | Admit: 2023-09-08 | Discharge: 2023-09-08 | Disposition: A | Discharge status: Home / Self Care | Payer: Medicaid Other | Attending: Emergency Medicine | Admitting: Emergency Medicine

## 2023-09-08 DIAGNOSIS — J0101 Acute recurrent maxillary sinusitis: Secondary | ICD-10-CM

## 2023-09-08 MED ORDER — DOXYCYCLINE HYCLATE 100 MG PO TABS
100.0000 mg | ORAL_TABLET | Freq: Once | ORAL | Status: AC
  Administered 2023-09-08: 100 mg via ORAL
  Filled 2023-09-08: qty 1

## 2023-09-08 MED ORDER — HYDROCOD POLI-CHLORPHE POLI ER 10-8 MG/5ML PO SUER
5.0000 mL | Freq: Once | ORAL | Status: AC
  Administered 2023-09-08: 5 mL via ORAL
  Filled 2023-09-08: qty 5

## 2023-09-08 MED ORDER — DEXAMETHASONE 4 MG PO TABS
10.0000 mg | ORAL_TABLET | Freq: Once | ORAL | Status: AC
  Administered 2023-09-08: 10 mg via ORAL
  Filled 2023-09-08: qty 3

## 2023-09-08 MED ORDER — HYDROCOD POLI-CHLORPHE POLI ER 10-8 MG/5ML PO SUER: 5.0000 mL | Freq: Two times a day (BID) | ORAL | 0 refills | Status: AC | PRN

## 2023-09-08 MED ORDER — DOXYCYCLINE HYCLATE 100 MG PO TABS
100.0000 mg | ORAL_TABLET | Freq: Two times a day (BID) | ORAL | 0 refills | Status: AC
Start: 2023-09-08 — End: 2023-09-18

## 2023-09-08 NOTE — ED Notes (Signed)
RN to bedside to introduce self to pt. Pt is disgruntled and is demanding a private room./ Pt states " I will be calling my insurance company bc I am not paying for this visit bc I didn't get a private room". Pt refusing to speak to provider in the hallway about medical reason why they are here.

## 2023-09-08 NOTE — Discharge Instructions (Addendum)
 You may alternate Tylenol 1000 mg every 6 hours as needed for pain, fever and Ibuprofen 800 mg every 6-8 hours as needed for pain, fever.  Please take Ibuprofen with food.  Do not take more than 4000 mg of Tylenol (acetaminophen) in a 24 hour period.   You are being provided a prescription for opiates (also known as narcotics) for pain control.  Opiates can be addictive and should only be used when absolutely necessary for pain control when other alternatives do not work.  We recommend you only use them for the recommended amount of time and only as prescribed.  Please do not take with other sedative medications or alcohol.  Please do not drive, operate machinery, make important decisions while taking opiates.  Please note that these medications can be addictive and have high abuse potential.  Patients can become addicted to narcotics after only taking them for a few days.  Please keep these medications locked away from children, teenagers or any family members with history of substance abuse.  Narcotic pain medicine may also make you constipated.  You may use over-the-counter medications such as MiraLAX, Colace to prevent constipation.  If you become constipated, you may use over-the-counter enemas as needed.  Itching and nausea are also common side effects of narcotic pain medication.  If you develop uncontrolled vomiting or a rash, please stop these medications and seek medical care.

## 2023-09-08 NOTE — ED Provider Notes (Signed)
[facility] Provider Note    Event Date/Time   First MD Initiated Contact with Patient 09/08/23 0126     (approximate)   History   Influenza   HPI  [place] A Strutz is a 53 y.o. adult with history of hypertension, CVA, seizures, HIV who presents to the emergency department with cough, congestion, sinus pressure, throat pain, bilateral ear pain ongoing for a week.  Reports has been on Bactrim previously for similar symptoms but states this does not help.  States doxycycline has helped her significantly in the past.  Requesting a prescription of doxycycline today.  Stating that she feels that she needs longer than 7 days to help get rid of her sinus infection.  She is worried that she is developing pneumonia but declines a chest x-ray.  She states she is having pain from coughing.   History provided by patient.    Past Medical History:  Diagnosis Date   HIV (human immunodeficiency virus infection) (HCC)    Hypertension    Seizures (HCC)    Stroke [facility])     Past Surgical History:  Procedure Laterality Date   BRAIN SURGERY     COSMETIC SURGERY      MEDICATIONS:  Prior to Admission medications   Medication Sig Start Date End Date Taking? Authorizing Provider  albuterol (VENTOLIN HFA) 108 (90 Base) MCG/ACT inhaler Inhale 2 puffs into the lungs every 4 (four) hours as needed for wheezing or shortness of breath. 07/28/21   Cuthriell, Delorise Royals, PA-C  aspirin 325 MG tablet Take 325 mg by mouth daily.    [provider]  Dolutegravir-lamiVUDine 50-300 MG TABS Take 1 tablet by mouth daily.    [provider]  efavirenz-emtricitabine-tenofovir (ATRIPLA) 600-200-300 MG per tablet Take 1 tablet by mouth at bedtime.    [provider]  estradiol (ESTRACE) 1 MG tablet Take 4 mg by mouth daily.    [provider]  ketorolac (TORADOL) 10 MG tablet Take 1 tablet (10 mg total) by mouth every 6 (six) hours as needed. 07/09/23    Chesley Noon, MD  levETIRAcetam (KEPPRA) 500 MG tablet Take 1 tablet (500 mg total) by mouth 2 (two) times daily. 01/30/18 10/11/18  Willy Eddy, MD  predniSONE (DELTASONE) 50 MG tablet Take 1 tablet (50 mg total) by mouth daily with breakfast. 11/22/22   Cuthriell, Delorise Royals, PA-C  spironolactone (ALDACTONE) 100 MG tablet Take 100 mg by mouth every morning. 09/14/14   [provider]  sulfamethoxazole-trimethoprim (BACTRIM DS) 800-160 MG tablet Take 1 tablet by mouth 2 (two) times daily. 11/22/22   Cuthriell, Delorise Royals, PA-C  tetrahydrozoline-zinc (VISINE-AC) 0.05-0.25 % ophthalmic solution Place 2 drops into the left eye 3 (three) times daily as needed. 01/18/19   Enid Derry, PA-C    Physical Exam   Triage Vital Signs: ED Triage Vitals  Encounter Vitals Group     BP 09/07/23 2037 139/64     Systolic BP Percentile --      Diastolic BP Percentile --      Pulse Rate 09/07/23 2037 88     Resp 09/07/23 2037 18     Temp 09/07/23 2037 98 F (36.7 C)     Temp Source 09/07/23 2037 Oral     SpO2 09/07/23 2037 99 %     Weight 09/07/23 2034 160 lb (72.6 kg)     Height 09/07/23 2034 5\' 6"  (1.676 m)     Head Circumference --  Peak Flow --      Pain Score 09/07/23 2034 10     Pain Loc --      Pain Education --      Exclude from Growth Chart --     Most recent vital signs: Vitals:   09/07/23 2037  BP: 139/64  Pulse: 88  Resp: 18  Temp: 98 F (36.7 C)  SpO2: 99%    CONSTITUTIONAL: Alert, responds appropriately to questions. Well-appearing; well-nourished HEAD: Normocephalic, atraumatic EYES: Conjunctivae clear, pupils appear equal, sclera nonicteric ENT: normal nose; moist mucous membranes, fluid noted behind the right TM.  No erythema, bulging, purulence, drainage, perforation.  Left TM appears normal.  No redness, warmth, swelling or tenderness over the mastoid process.  No pharyngeal erythema or petechiae, no tonsillar hypertrophy or exudate, no uvular  deviation, no unilateral swelling in posterior oropharynx, no trismus or drooling, no muffled voice, normal phonation, no stridor, airway patent.  Tender over the maxillary and frontal sinuses without soft tissue facial swelling, redness or warmth. NECK: Supple, normal ROM CARD: RRR; S1 and S2 appreciated RESP: Normal chest excursion without splinting or tachypnea; breath sounds clear and equal bilaterally; no wheezes, no rhonchi, no rales, no hypoxia or respiratory distress, speaking full sentences ABD/GI: Non-distended; soft, non-tender, no rebound, no guarding, no peritoneal signs BACK: The back appears normal EXT: Normal ROM in all joints; no deformity noted, no edema SKIN: Normal color for age and race; warm; no rash on exposed skin NEURO: Moves all extremities equally, normal speech PSYCH: The patient's mood and manner are appropriate.   ED Results / Procedures / Treatments   LABS: (all labs ordered are listed, but only abnormal results are displayed) Labs Reviewed  RESP PANEL BY RT-PCR (RSV, FLU A&B, COVID)  RVPGX2     EKG:   RADIOLOGY: My personal review and interpretation of imaging:    I have personally reviewed all radiology reports.   No results found.   PROCEDURES:  Critical Care performed: No      Procedures    IMPRESSION / MDM / ASSESSMENT AND PLAN / ED COURSE  I reviewed the triage vital signs and the nursing notes.    Patient here with cough, congestion, sore throat, ear pain.    DIFFERENTIAL DIAGNOSIS (includes but not limited to):   Viral URI, pneumonia, sinusitis, allergic rhinitis   Patient's presentation is most consistent with acute complicated illness / injury requiring diagnostic workup.   PLAN: COVID, flu and RSV negative.  Patient declines chest x-ray.  Has history of recurrent maxillary sinusitis.  Last CD4 count on 07/10/2023 was 22.  Given patient is significantly immunocompromised, I feel that is reasonable to start her on  antibiotics.  She feels like doxycycline has helped her in the past.  This should help cover sinusitis but also community-acquired pneumonia.  She declines prescription for Bactrim even though she is at risk for PCP and declines a chest x-ray today.  Lungs are currently clear to auscultation.  She is afebrile and nontoxic in appearance.  No hypoxia or respiratory distress.  Will give dose of Decadron as well for symptomatic relief here and discharged with Tussionex for pain control due to pain with coughing.   MEDICATIONS GIVEN IN ED: Medications  doxycycline (VIBRA-TABS) tablet 100 mg (has no administration in time range)  chlorpheniramine-HYDROcodone (TUSSIONEX) 10-8 MG/5ML suspension 5 mL (has no administration in time range)  dexamethasone (DECADRON) tablet 10 mg (has no administration in time range)     ED COURSE:  At this time, I do not feel there is any life-threatening condition present. I reviewed all nursing notes, vitals, pertinent previous records.  All lab and urine results, EKGs, imaging ordered have been independently reviewed and interpreted by myself.  I reviewed all available radiology reports from any imaging ordered this visit.  Based on my assessment, I feel the patient is safe to be discharged home without further emergent workup and can continue workup as an outpatient as needed. Discussed all findings, treatment plan as well as usual and customary return precautions.  They verbalize understanding and are comfortable with this plan.  Outpatient follow-up has been provided as needed.  All questions have been answered.    CONSULTS:  none   OUTSIDE RECORDS REVIEWED: Reviewed recent infectious disease notes at [facility].       FINAL CLINICAL IMPRESSION(S) / ED DIAGNOSES   Final diagnoses:  Acute recurrent maxillary sinusitis     Rx / DC Orders   ED Discharge Orders          Ordered    chlorpheniramine-HYDROcodone (TUSSIONEX) 10-8 MG/5ML  Every 12 hours PRN         09/08/23 0142    doxycycline (VIBRA-TABS) 100 MG tablet  2 times daily        09/08/23 0142             Note:  This document was prepared using Dragon voice recognition software and may include unintentional dictation errors.   Logan Vegh, Layla Maw, DO 09/08/23 0206

## 2023-11-05 ENCOUNTER — Emergency Department
Admission: EM | Admit: 2023-11-05 | Discharge: 2023-11-05 | Disposition: A | Discharge status: Home / Self Care | Attending: Emergency Medicine | Admitting: Emergency Medicine

## 2023-11-05 ENCOUNTER — Other Ambulatory Visit: Payer: Self-pay

## 2023-11-05 DIAGNOSIS — J019 Acute sinusitis, unspecified: Secondary | ICD-10-CM | POA: Insufficient documentation

## 2023-11-05 DIAGNOSIS — J329 Chronic sinusitis, unspecified: Secondary | ICD-10-CM

## 2023-11-05 DIAGNOSIS — Z7982 Long term (current) use of aspirin: Secondary | ICD-10-CM | POA: Diagnosis not present

## 2023-11-05 DIAGNOSIS — R0981 Nasal congestion: Secondary | ICD-10-CM | POA: Diagnosis present

## 2023-11-05 LAB — RESP PANEL BY RT-PCR (RSV, FLU A&B, COVID)  RVPGX2
Influenza A by PCR: NEGATIVE
Influenza B by PCR: NEGATIVE
Resp Syncytial Virus by PCR: NEGATIVE
SARS Coronavirus 2 by RT PCR: NEGATIVE

## 2023-11-05 MED ORDER — CLINDAMYCIN HCL 150 MG PO CAPS
300.0000 mg | ORAL_CAPSULE | Freq: Once | ORAL | Status: AC
  Administered 2023-11-05: 300 mg via ORAL
  Filled 2023-11-05: qty 2

## 2023-11-05 MED ORDER — CLINDAMYCIN HCL 300 MG PO CAPS: 300.0000 mg | ORAL_CAPSULE | Freq: Three times a day (TID) | ORAL | 0 refills | Status: AC

## 2023-11-05 NOTE — ED Notes (Signed)
Pt ambulatory to exam room. 
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
## 2023-11-05 NOTE — ED Triage Notes (Signed)
Pt reports cough and congestion x 1 week.

## 2023-11-05 NOTE — ED Notes (Signed)
 Pt verbalizes understanding of discharge instructions. Opportunity for questioning and answers were provided. Pt discharged from ED to home.   ? ?

## 2024-04-28 ENCOUNTER — Encounter: Payer: Self-pay | Admitting: Intensive Care

## 2024-04-28 ENCOUNTER — Other Ambulatory Visit: Payer: Self-pay

## 2024-04-28 ENCOUNTER — Emergency Department
Admission: EM | Admit: 2024-04-28 | Discharge: 2024-04-28 | Disposition: A | Discharge status: Home / Self Care | Attending: Emergency Medicine | Admitting: Emergency Medicine

## 2024-04-28 DIAGNOSIS — J0100 Acute maxillary sinusitis, unspecified: Secondary | ICD-10-CM | POA: Diagnosis not present

## 2024-04-28 DIAGNOSIS — L2389 Allergic contact dermatitis due to other agents: Secondary | ICD-10-CM | POA: Diagnosis not present

## 2024-04-28 DIAGNOSIS — R21 Rash and other nonspecific skin eruption: Secondary | ICD-10-CM | POA: Diagnosis present

## 2024-04-28 MED ORDER — PREDNISONE 50 MG PO TABS: 50.0000 mg | ORAL_TABLET | Freq: Every day | ORAL | 0 refills | Status: AC

## 2024-04-28 MED ORDER — CLINDAMYCIN HCL 300 MG PO CAPS: 300.0000 mg | ORAL_CAPSULE | Freq: Three times a day (TID) | ORAL | 0 refills | Status: AC

## 2024-04-28 MED ORDER — PREDNISONE 20 MG PO TABS
60.0000 mg | ORAL_TABLET | Freq: Once | ORAL | Status: AC
  Administered 2024-04-28: 60 mg via ORAL
  Filled 2024-04-28: qty 3

## 2024-04-28 MED ORDER — CETIRIZINE HCL 10 MG PO TABS: 10.0000 mg | ORAL_TABLET | Freq: Every day | ORAL | 0 refills | Status: DC

## 2024-04-28 MED ORDER — FAMOTIDINE 20 MG PO TABS
20.0000 mg | ORAL_TABLET | Freq: Once | ORAL | Status: AC
  Administered 2024-04-28: 20 mg via ORAL
  Filled 2024-04-28: qty 1

## 2024-04-28 NOTE — ED Triage Notes (Signed)
 Patient reports rash on face and neck.

## 2024-04-28 NOTE — Discharge Instructions (Addendum)
 He has been diagnosed with contact dermatitis, sinus infection.  Please avoid using hair removal product.  Please take prednisone  1 tablet with breakfast.  Please take cetirizine  1 tablet a day for itchiness and nasal congestion.  Please take clindamycin  1 capsule every 8 hours after main meals.  Please drink plenty of fluids.   Please come back to ED or go to your PCP you have new symptoms or symptoms worsen. It was a pleasure to help you today.

## 2024-04-28 NOTE — ED Provider Notes (Signed)
 [facility] Provider Note    Event Date/Time   First MD Initiated Contact with Patient 04/28/24 1607     (approximate)   History   Rash    HPI  Jonathan Deleon is a 53 y.o. adult    with a past medical history of sinus infection, HIV, gender dysphoria, genital herpes simplex, chronic sinus infection, who presents to the ED complaining of rash. According to the patient, rash started 24 hours ago, associated to pruritus.  Rash is located only on her face, posterior and anterior anterior cervical area, anterior chest and posterior chest.  Patient endorses last night she used it a new hair removal product.  Patient also complains of maxillary pain, posterior nasal drip, nasal congestion.     Patient Active Problem List   Diagnosis Date Noted   Seizure (HCC) 03/18/2016     ROS: Patient currently denies any vision changes, tinnitus, difficulty speaking, facial droop, sore throat, chest pain, shortness of breath, abdominal pain, nausea/vomiting/diarrhea, dysuria, or weakness/numbness/paresthesias in any extremity   Physical Exam   Triage Vital Signs: ED Triage Vitals  Encounter Vitals Group     BP 04/28/24 1415 (!) 144/107     Girls Systolic BP Percentile --      Girls Diastolic BP Percentile --      Boys Systolic BP Percentile --      Boys Diastolic BP Percentile --      Pulse Rate 04/28/24 1415 90     Resp 04/28/24 1415 16     Temp 04/28/24 1415 98.2 F (36.8 C)     Temp Source 04/28/24 1415 Oral     SpO2 04/28/24 1415 96 %     Weight 04/28/24 1417 130 lb 1.1 oz (59 kg)     Height 04/28/24 1417 5' 9 (1.753 m)     Head Circumference --      Peak Flow --      Pain Score 04/28/24 1417 8     Pain Loc --      Pain Education --      Exclude from Growth Chart --     Most recent vital signs: Vitals:   04/28/24 1415  BP: (!) 144/107  Pulse: 90  Resp: 16  Temp: 98.2 F (36.8 C)  SpO2: 96%     Physical Exam Vitals and nursing note  reviewed.  During triage patient was hypertensive  Constitutional:      General: Awake and alert. No acute distress.  No difficulty breathing, no shortness of breath.    Appearance: Normal appearance. The patient is normal weight.      Able to speak in complete sentences without cough or dyspnea  HENT:     Head: Normocephalic and atraumatic.     Mouth: Mucous membranes are moist.  No lip edema.  Eyes: No periorbital edema    General: PERRL. Normal EOMs          Conjunctiva/sclera: Conjunctivae normal.  Nose Nasal congestion/rhinorrhea.  Tenderness to palpation in the frontal and maxillary areas.  CV:                  Good peripheral perfusion.  Regular rate and rhythm  Resp:               Normal effort.  Equal breath sounds bilaterally.  No wheezing Abd:                 No distention.  Soft, nontender.  No rebound or guarding.  Musculoskeletal:        General: No swelling. Normal range of motion.  Skin:    General: Skin is warm and dry.     Capillary Refill: Capillary refill takes less than 2 seconds.     Findings: Papular rash in forehead, maxillary area, retroauricular area, anterior superior chest, posterior cervical area.  No hives.  Neurological:     Mental Status: The patient is awake and alert. MAE spontaneously. No gross focal neurologic deficits are appreciated.  Psychiatric Mood and affect are normal. Speech and behavior are normal.  ED Results / Procedures / Treatments   Labs (all labs ordered are listed, but only abnormal results are displayed) Labs Reviewed - No data to display   PROCEDURES:  Critical Care performed:   Procedures   MEDICATIONS ORDERED IN ED: Medications  predniSONE  (DELTASONE ) tablet 60 mg (has no administration in time range)  famotidine  (PEPCID ) tablet 20 mg (has no administration in time range)      IMPRESSION / MDM / ASSESSMENT AND PLAN / ED COURSE  I reviewed the triage vital signs and the nursing notes.  Differential diagnosis  includes, but is not limited to, contact dermatitis, viral exanthema, allergic drug reaction, food allergy, unlikely cellulitis  Patient's presentation is most consistent with acute, uncomplicated illness.    Jonathan Deleon is a 53 y.o., adult who presents today with history of 24 hours of rash on her face anterior and posterior chest, associated to pruritus.  On physical exam evidence of papular rash on face, retroauricular area, posterior cervical area, anterior chest.  Tenderness to palpation in maxillary and frontal sinuses.  Rest of the physical exam is normal Plan Prednisone , famotidine  Discharge Patient's diagnosis is consistent with contact dermatitis, with evidence that the patient started using last night a new hair removal on her anterior neck.  The rash is located around the neck on her superior chest and face, upper and lower extremities are clear. I did review the patient's allergies and medications.The patient is in stable and satisfactory condition for discharge home  Patient will be discharged home with prescriptions for prednisone , cetirizine . Patient is to follow up with PCP as needed or otherwise directed. Patient is given ED precautions to return to the ED for any worsening or new symptoms.  I did recommend the patient to stop using the new her removal product. Discussed plan of care with patient, answered all of patient's questions, Patient agreeable to plan of care. Advised patient to take medications according to the instructions on the label. Discussed possible side effects of new medications. Patient verbalized understanding.  FINAL CLINICAL IMPRESSION(S) / ED DIAGNOSES   Final diagnoses:  Allergic contact dermatitis due to other agents  Subacute maxillary sinusitis     Rx / DC Orders   ED Discharge Orders          Ordered    predniSONE  (DELTASONE ) 50 MG tablet  Daily with breakfast        04/28/24 1648    cetirizine  (ZYRTEC  ALLERGY) 10 MG tablet  Daily         04/28/24 1648    clindamycin  (CLEOCIN ) 300 MG capsule  3 times daily        04/28/24 1648             Note:  This document was prepared using Dragon voice recognition software and may include unintentional dictation errors.   Janit Kast, PA-C 04/28/24 1649    Ray,  Neha, MD 04/28/24 [phone]

## 2024-05-05 NOTE — Telephone Encounter (Signed)
 Medication Requested: doxycycline  (MONODOX ) 100 MG capsule    Future Appointments  Date Time Provider Department Center  05/10/2024  2:05 PM Omar Clora Mike, MD [facility]  06/30/2024  1:00 PM Jerolyn Ozell Ned, OD [facility]  07/06/2024  4:00 PM Berdine Lucy I, FNP [facility]   Per Provider Note: in med list  Standing order protocol requirements met?: No  Sent to: Provider for signing  Days Supply Given: TBD Number of Refills: TBD
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
## 2024-07-13 ENCOUNTER — Emergency Department

## 2024-07-13 ENCOUNTER — Emergency Department: Admission: EM | Admit: 2024-07-13 | Discharge: 2024-07-13 | Disposition: A | Discharge status: Home / Self Care

## 2024-07-13 ENCOUNTER — Other Ambulatory Visit: Payer: Self-pay

## 2024-07-13 DIAGNOSIS — J309 Allergic rhinitis, unspecified: Secondary | ICD-10-CM

## 2024-07-13 DIAGNOSIS — J320 Chronic maxillary sinusitis: Secondary | ICD-10-CM

## 2024-07-13 LAB — RESP PANEL BY RT-PCR (RSV, FLU A&B, COVID)  RVPGX2
Influenza A by PCR: NEGATIVE
Influenza B by PCR: NEGATIVE
Resp Syncytial Virus by PCR: NEGATIVE
SARS Coronavirus 2 by RT PCR: NEGATIVE

## 2024-07-13 MED ORDER — DOXYCYCLINE HYCLATE 100 MG PO TABS
100.0000 mg | ORAL_TABLET | Freq: Once | ORAL | Status: AC
  Administered 2024-07-13: 100 mg via ORAL
  Filled 2024-07-13: qty 1

## 2024-07-13 MED ORDER — LORATADINE 10 MG PO TABS: 10.0000 mg | ORAL_TABLET | Freq: Every day | ORAL | 0 refills | Status: AC

## 2024-07-13 MED ORDER — DOXYCYCLINE MONOHYDRATE 100 MG PO TABS: 100.0000 mg | ORAL_TABLET | Freq: Two times a day (BID) | ORAL | 0 refills | Status: AC

## 2024-07-13 NOTE — ED Triage Notes (Signed)
 Pt reports nasal congestion and cough for the past 2 months

## 2024-07-13 NOTE — Discharge Instructions (Addendum)
 You were seen in the emergency department for suspected sinusitis.  Please pick up and take the antibiotics as prescribed given that your symptoms have been present for 2 months.  I will also send in a new allergy prescription medicine for you in case this is allergy related. Stop taking the Zyrtec .  Please pick it up and take it as prescribed. Follow up with the ear, nose, throat doctor listed.  Return to the emergency department with any new emergent, worsening or concerning symptoms.

## 2024-07-13 NOTE — ED Provider Notes (Signed)
 [facility] Provider Note    Event Date/Time   First MD Initiated Contact with Patient 07/13/24 2150     (approximate)   History   Nasal Congestion   HPI  Jonathan Deleon is a 53 y.o. adult , transgender male to male, with a past medical history of seizures, HTN, CVA, HIV presents to the emergency department with nasal congestion and cough, postnasal drip, sinus tenderness x 2-3 months.  She denies chest pain, shortness of breath, headache, dizziness, sore throat.  No recent sick contacts.  She has been using her cetirizine  at home.  She reports she was last seen for this concern in September of this year, given clindamycin , cetirizine  and prednisone  which did not help her symptoms.  She has not seen a ENT specialist for this concern.    Physical Exam   Triage Vital Signs: ED Triage Vitals  Encounter Vitals Group     BP 07/13/24 2029 129/88     Girls Systolic BP Percentile --      Girls Diastolic BP Percentile --      Boys Systolic BP Percentile --      Boys Diastolic BP Percentile --      Pulse Rate 07/13/24 2029 88     Resp 07/13/24 2029 19     Temp 07/13/24 2029 98 F (36.7 C)     Temp src --      SpO2 07/13/24 2029 100 %     Weight 07/13/24 2028 160 lb (72.6 kg)     Height 07/13/24 2028 5' 10 (1.778 m)     Head Circumference --      Peak Flow --      Pain Score 07/13/24 2028 5     Pain Loc --      Pain Education --      Exclude from Growth Chart --     Most recent vital signs: Vitals:   07/13/24 2029  BP: 129/88  Pulse: 88  Resp: 19  Temp: 98 F (36.7 C)  SpO2: 100%    General: Awake, in no acute distress. Appears stated age. Head: Normocephalic, atraumatic. Eyes: No scleral icterus or conjunctival injection. Ears/Nose/Throat: TMs intact b/l, no erythema or exudate. Nares patent, no nasal discharge. Oropharynx moist, no erythema or exudate. Dentition intact. TTP along maxillary sinus regions b/l. Neck: Supple, no  lymphadenopathy, no nuchal rigidity. CV: Good peripheral perfusion.  Respiratory:Normal respiratory effort.  No respiratory distress.  GI: Soft, non-distended. Skin:Warm, dry, intact.  Neurological: A&Ox4 to person, place, time, and situation.   ED Results / Procedures / Treatments   Labs (all labs ordered are listed, but only abnormal results are displayed) Labs Reviewed  RESP PANEL BY RT-PCR (RSV, FLU A&B, COVID)  [id number]     EKG     RADIOLOGY    PROCEDURES:  Critical Care performed: No   Procedures   MEDICATIONS ORDERED IN ED: Medications  doxycycline  (VIBRA -TABS) tablet 100 mg (100 mg Oral Given 07/13/24 2233)     IMPRESSION / MDM / ASSESSMENT AND PLAN / ED COURSE  I reviewed the triage vital signs and the nursing notes.                              Differential diagnosis includes, but is not limited to, acute on chronic sinusitis, allergic rhinitis, less likely COVID, flu, RSV  Patient's presentation is most consistent with exacerbation of chronic illness.  Patient is a patient is a 53 y/o transgender male to male with chronic nasal congestion and cough as well as postnasal drip.  Well-appearing with normal vital signs on exam.  Afebrile.  Respiratory panel ordered in triage and was negative for COVID, flu, RSV. Considered CT scan of sinuses but this does not change my plan of management.  Will trial doxycycline , discontinue cetirizine , and change to Claritin  to see if this improves her symptoms.  They requested follow-up with an ENT provider in the [facility] area, so I placed the referral for her.  The patient may return to the emergency department for any new, worsening, or concerning symptoms. Patient was given the opportunity to ask questions; all questions were answered. Emergency department return precautions were discussed with the patient.  Patient is in agreement to the treatment plan.  Patient is stable for discharge.    FINAL CLINICAL  IMPRESSION(S) / ED DIAGNOSES   Final diagnoses:  Chronic maxillary sinusitis  Allergic rhinitis, unspecified seasonality, unspecified trigger     Rx / DC Orders   ED Discharge Orders          Ordered    loratadine  (CLARITIN ) 10 MG tablet  Daily        07/13/24 2227    doxycycline  (ADOXA) 100 MG tablet  2 times daily        07/13/24 2227             Note:  This document was prepared using Dragon voice recognition software and may include unintentional dictation errors.     Sheron Salm, PA-C 07/13/24 2253    Nicholaus Rolland BRAVO, MD 07/13/24 [phone]

## 2024-07-25 ENCOUNTER — Other Ambulatory Visit: Payer: Self-pay

## 2024-07-25 ENCOUNTER — Emergency Department
Admission: EM | Admit: 2024-07-25 | Discharge: 2024-07-25 | Disposition: A | Discharge status: Home / Self Care | Source: Home / Self Care | Attending: Emergency Medicine | Admitting: Emergency Medicine

## 2024-07-25 DIAGNOSIS — R21 Rash and other nonspecific skin eruption: Secondary | ICD-10-CM | POA: Insufficient documentation

## 2024-07-25 DIAGNOSIS — Z21 Asymptomatic human immunodeficiency virus [HIV] infection status: Secondary | ICD-10-CM | POA: Insufficient documentation

## 2024-07-25 DIAGNOSIS — I10 Essential (primary) hypertension: Secondary | ICD-10-CM | POA: Diagnosis not present

## 2024-07-25 DIAGNOSIS — J3489 Other specified disorders of nose and nasal sinuses: Secondary | ICD-10-CM | POA: Diagnosis present

## 2024-07-25 DIAGNOSIS — J0191 Acute recurrent sinusitis, unspecified: Secondary | ICD-10-CM | POA: Diagnosis not present

## 2024-07-25 MED ORDER — HYDROXYZINE HCL 10 MG PO TABS: 10.0000 mg | ORAL_TABLET | Freq: Three times a day (TID) | ORAL | 0 refills | Status: AC | PRN

## 2024-07-25 MED ORDER — HYDROXYZINE HCL 25 MG PO TABS
25.0000 mg | ORAL_TABLET | Freq: Once | ORAL | Status: AC
  Administered 2024-07-25: 25 mg via ORAL
  Filled 2024-07-25: qty 1

## 2024-07-25 MED ORDER — CEFDINIR 300 MG PO CAPS: 300.0000 mg | ORAL_CAPSULE | Freq: Two times a day (BID) | ORAL | 0 refills | Status: AC

## 2024-07-25 NOTE — ED Provider Notes (Signed)
 "  [facility] Provider Note    Event Date/Time   First MD Initiated Contact with Patient 07/25/24 0320     (approximate)   History   Chief Complaint Rash   HPI  Jonathan Deleon is a 53 y.o. adult with past medical history of hypertension, seizures, stroke, and HIV who presents to the ED complaining of rash.  Patient reports they were recently started on doxycycline  for a sinus infection and since then have developed itchy raised rash across much of their body.  They deny any difficulty breathing, lightheadedness, vomiting, or diarrhea.  They also state that they continue to have sinus drainage and discomfort without fever, cough, or difficulty breathing.     Physical Exam   Triage Vital Signs: ED Triage Vitals  Encounter Vitals Group     BP 07/25/24 0220 (!) 120/90     Girls Systolic BP Percentile --      Girls Diastolic BP Percentile --      Boys Systolic BP Percentile --      Boys Diastolic BP Percentile --      Pulse Rate 07/25/24 0220 92     Resp 07/25/24 0220 18     Temp 07/25/24 0220 98 F (36.7 C)     Temp Source 07/25/24 0220 Oral     SpO2 07/25/24 0220 100 %     Weight --      Height --      Head Circumference --      Peak Flow --      Pain Score 07/25/24 0217 0     Pain Loc --      Pain Education --      Exclude from Growth Chart --     Most recent vital signs: Vitals:   07/25/24 0220  BP: (!) 120/90  Pulse: 92  Resp: 18  Temp: 98 F (36.7 C)  SpO2: 100%    Constitutional: Alert and oriented. Eyes: Conjunctivae are normal. Head: Atraumatic. Nose: No congestion/rhinnorhea. Mouth/Throat: Mucous membranes are moist.  Cardiovascular: Normal rate, regular rhythm. Grossly normal heart sounds.  2+ radial pulses bilaterally. Respiratory: Normal respiratory effort.  No retractions. Lungs CTAB. Gastrointestinal: Soft and nontender. No distention. Musculoskeletal: No lower extremity tenderness nor edema.  Red raised rash noted  to both arms. Neurologic:  Normal speech and language. No gross focal neurologic deficits are appreciated.    ED Results / Procedures / Treatments   Labs (all labs ordered are listed, but only abnormal results are displayed) Labs Reviewed - No data to display   PROCEDURES:  Critical Care performed: No  Procedures   MEDICATIONS ORDERED IN ED: Medications  hydrOXYzine  (ATARAX ) tablet 25 mg (has no administration in time range)     IMPRESSION / MDM / ASSESSMENT AND PLAN / ED COURSE  I reviewed the triage vital signs and the nursing notes.                              53 y.o. adult with past medical history of hypertension, seizures, stroke, and HIV who presents to the ED complaining of 2 to 3 days of red raised rash across their body after starting antibiotic for sinus infection.  Patient's presentation is most consistent with acute, uncomplicated illness.  Differential diagnosis includes, but is not limited to, sinusitis, hives, anaphylaxis.  Patient well-appearing and in no acute distress, vital signs are unremarkable.  They have a red  raised rash across their arms and chest consistent with allergic reaction, appears likely due to doxycycline .  We will treat symptomatically with Atarax , no findings concerning for anaphylaxis or other severe reaction.  Plan to change antibiotic to cephalosporin for treatment of sinusitis as patient has previously tolerated cephalosporins.  They are appropriate for discharge home with PCP follow-up, were counseled to return to the ED for new or worsening symptoms.  Patient agrees with plan.      FINAL CLINICAL IMPRESSION(S) / ED DIAGNOSES   Final diagnoses:  Rash  Acute recurrent sinusitis, unspecified location     Rx / DC Orders   ED Discharge Orders          Ordered    hydrOXYzine  (ATARAX ) 10 MG tablet  3 times daily PRN        07/25/24 0401    cefdinir  (OMNICEF ) 300 MG capsule  2 times daily        07/25/24 0401              Note:  This document was prepared using Dragon voice recognition software and may include unintentional dictation errors.   Willo Dunnings, MD 07/25/24 [phone]  "

## 2024-07-25 NOTE — ED Triage Notes (Signed)
 Pt presents for rash after completing a course of Doxycycline . Finished yesterday. Small bumps noted to skin with no redness noted. Took 1 Benadryl  yesterday without relief. No airway compromise.
